# Patient Record
Sex: Female | Born: 1945 | Race: White | Hispanic: No | Smoking: Former smoker
Health system: Southern US, Community
[De-identification: ages and names within clinical notes are randomized; demographics above are authoritative.]

## PROBLEM LIST (undated history)

## (undated) DIAGNOSIS — D72829 Elevated white blood cell count, unspecified: Secondary | ICD-10-CM

## (undated) DIAGNOSIS — E039 Hypothyroidism, unspecified: Secondary | ICD-10-CM

## (undated) DIAGNOSIS — I10 Essential (primary) hypertension: Secondary | ICD-10-CM

## (undated) DIAGNOSIS — C50919 Malignant neoplasm of unspecified site of unspecified female breast: Secondary | ICD-10-CM

## (undated) DIAGNOSIS — R000 Tachycardia, unspecified: Secondary | ICD-10-CM

## (undated) DIAGNOSIS — E785 Hyperlipidemia, unspecified: Secondary | ICD-10-CM

## (undated) DIAGNOSIS — G4733 Obstructive sleep apnea (adult) (pediatric): Secondary | ICD-10-CM

## (undated) HISTORY — DX: Malignant neoplasm of unspecified site of unspecified female breast: C50.919

## (undated) HISTORY — DX: Elevated white blood cell count, unspecified: D72.829

## (undated) HISTORY — PX: OTHER SURGICAL HISTORY: SHX169

## (undated) HISTORY — DX: Hypothyroidism, unspecified: E03.9

## (undated) HISTORY — DX: Obstructive sleep apnea (adult) (pediatric): G47.33

## (undated) HISTORY — PX: MASTECTOMY: SHX3

## (undated) HISTORY — DX: Hyperlipidemia, unspecified: E78.5

## (undated) HISTORY — DX: Tachycardia, unspecified: R00.0

## (undated) HISTORY — DX: Essential (primary) hypertension: I10

---

## 1998-03-21 ENCOUNTER — Ambulatory Visit (HOSPITAL_BASED_OUTPATIENT_CLINIC_OR_DEPARTMENT_OTHER): Admission: RE | Admit: 1998-03-21 | Discharge: 1998-03-21 | Payer: Self-pay | Admitting: General Surgery

## 1998-07-25 ENCOUNTER — Other Ambulatory Visit: Admission: RE | Admit: 1998-07-25 | Discharge: 1998-07-25 | Payer: Self-pay | Admitting: Obstetrics and Gynecology

## 2001-05-13 ENCOUNTER — Encounter: Admission: RE | Admit: 2001-05-13 | Discharge: 2001-05-13 | Payer: Self-pay | Admitting: *Deleted

## 2002-06-08 ENCOUNTER — Encounter: Payer: Self-pay | Admitting: Hematology and Oncology

## 2002-06-08 ENCOUNTER — Encounter: Admission: RE | Admit: 2002-06-08 | Discharge: 2002-06-08 | Payer: Self-pay | Admitting: Hematology and Oncology

## 2003-06-14 ENCOUNTER — Encounter: Payer: Self-pay | Admitting: Family Medicine

## 2003-06-14 ENCOUNTER — Encounter: Admission: RE | Admit: 2003-06-14 | Discharge: 2003-06-14 | Payer: Self-pay | Admitting: Family Medicine

## 2004-07-11 ENCOUNTER — Encounter: Admission: RE | Admit: 2004-07-11 | Discharge: 2004-07-11 | Payer: Self-pay | Admitting: Family Medicine

## 2005-07-18 ENCOUNTER — Encounter: Admission: RE | Admit: 2005-07-18 | Discharge: 2005-07-18 | Payer: Self-pay | Admitting: Family Medicine

## 2006-07-21 ENCOUNTER — Encounter: Admission: RE | Admit: 2006-07-21 | Discharge: 2006-07-21 | Payer: Self-pay | Admitting: Family Medicine

## 2007-07-23 ENCOUNTER — Encounter: Admission: RE | Admit: 2007-07-23 | Discharge: 2007-07-23 | Payer: Self-pay | Admitting: Family Medicine

## 2008-07-25 ENCOUNTER — Encounter: Admission: RE | Admit: 2008-07-25 | Discharge: 2008-07-25 | Payer: Self-pay | Admitting: Family Medicine

## 2009-01-09 ENCOUNTER — Ambulatory Visit: Payer: Self-pay | Admitting: Oncology

## 2009-02-09 ENCOUNTER — Ambulatory Visit: Payer: Self-pay | Admitting: Oncology

## 2009-03-11 ENCOUNTER — Ambulatory Visit: Payer: Self-pay | Admitting: Oncology

## 2009-05-16 ENCOUNTER — Ambulatory Visit: Payer: Self-pay | Admitting: Oncology

## 2009-06-11 ENCOUNTER — Ambulatory Visit: Payer: Self-pay | Admitting: Oncology

## 2009-07-26 ENCOUNTER — Encounter: Admission: RE | Admit: 2009-07-26 | Discharge: 2009-07-26 | Payer: Self-pay | Admitting: Family Medicine

## 2009-08-11 ENCOUNTER — Ambulatory Visit: Payer: Self-pay | Admitting: Oncology

## 2009-08-29 ENCOUNTER — Ambulatory Visit: Payer: Self-pay | Admitting: Oncology

## 2009-09-11 ENCOUNTER — Ambulatory Visit: Payer: Self-pay | Admitting: Oncology

## 2010-07-27 ENCOUNTER — Encounter: Admission: RE | Admit: 2010-07-27 | Discharge: 2010-07-27 | Payer: Self-pay | Admitting: Family Medicine

## 2011-05-20 ENCOUNTER — Other Ambulatory Visit: Payer: Self-pay | Admitting: Family Medicine

## 2011-05-20 DIAGNOSIS — Z853 Personal history of malignant neoplasm of breast: Secondary | ICD-10-CM

## 2011-05-20 DIAGNOSIS — R55 Syncope and collapse: Secondary | ICD-10-CM

## 2011-05-24 ENCOUNTER — Ambulatory Visit
Admission: RE | Admit: 2011-05-24 | Discharge: 2011-05-24 | Disposition: A | Discharge status: Home / Self Care | Payer: Medicare Other | Source: Ambulatory Visit | Attending: Family Medicine | Admitting: Family Medicine

## 2011-05-24 DIAGNOSIS — R55 Syncope and collapse: Secondary | ICD-10-CM

## 2011-05-24 DIAGNOSIS — Z853 Personal history of malignant neoplasm of breast: Secondary | ICD-10-CM

## 2011-05-24 MED ORDER — GADOBENATE DIMEGLUMINE 529 MG/ML IV SOLN
13.0000 mL | Freq: Once | INTRAVENOUS | Status: AC | PRN
  Administered 2011-05-24: 13 mL via INTRAVENOUS

## 2011-06-24 ENCOUNTER — Other Ambulatory Visit: Payer: Self-pay | Admitting: Family Medicine

## 2011-06-24 DIAGNOSIS — Z9012 Acquired absence of left breast and nipple: Secondary | ICD-10-CM

## 2011-06-24 DIAGNOSIS — Z1231 Encounter for screening mammogram for malignant neoplasm of breast: Secondary | ICD-10-CM

## 2011-07-19 ENCOUNTER — Other Ambulatory Visit: Payer: Self-pay | Admitting: Family Medicine

## 2011-07-19 DIAGNOSIS — M949 Disorder of cartilage, unspecified: Secondary | ICD-10-CM

## 2011-07-30 ENCOUNTER — Ambulatory Visit: Payer: Medicare Other

## 2011-08-01 ENCOUNTER — Ambulatory Visit
Admission: RE | Admit: 2011-08-01 | Discharge: 2011-08-01 | Disposition: A | Discharge status: Home / Self Care | Payer: Medicare Other | Source: Ambulatory Visit | Attending: Family Medicine | Admitting: Family Medicine

## 2011-08-01 DIAGNOSIS — M899 Disorder of bone, unspecified: Secondary | ICD-10-CM

## 2011-08-01 DIAGNOSIS — Z9012 Acquired absence of left breast and nipple: Secondary | ICD-10-CM

## 2011-08-01 DIAGNOSIS — Z1231 Encounter for screening mammogram for malignant neoplasm of breast: Secondary | ICD-10-CM

## 2011-08-01 DIAGNOSIS — M949 Disorder of cartilage, unspecified: Secondary | ICD-10-CM

## 2011-08-02 ENCOUNTER — Encounter: Payer: Medicare Other | Admitting: Oncology

## 2011-08-02 ENCOUNTER — Other Ambulatory Visit: Payer: Self-pay | Admitting: Oncology

## 2011-08-02 ENCOUNTER — Encounter (HOSPITAL_BASED_OUTPATIENT_CLINIC_OR_DEPARTMENT_OTHER): Payer: Medicare Other | Admitting: Oncology

## 2011-08-02 DIAGNOSIS — C50419 Malignant neoplasm of upper-outer quadrant of unspecified female breast: Secondary | ICD-10-CM

## 2011-08-02 DIAGNOSIS — D72829 Elevated white blood cell count, unspecified: Secondary | ICD-10-CM

## 2011-08-02 LAB — COMPREHENSIVE METABOLIC PANEL
Albumin: 4.1 g/dL (ref 3.5–5.2)
Alkaline Phosphatase: 91 U/L (ref 39–117)
BUN: 12 mg/dL (ref 6–23)
CO2: 24 mEq/L (ref 19–32)
Glucose, Bld: 104 mg/dL — ABNORMAL HIGH (ref 70–99)
Potassium: 3.6 mEq/L (ref 3.5–5.3)

## 2011-08-02 LAB — CBC WITH DIFFERENTIAL/PLATELET
BASO%: 0.3 % (ref 0.0–2.0)
Basophils Absolute: 0 10*3/uL (ref 0.0–0.1)
EOS%: 1.6 % (ref 0.0–7.0)
HCT: 42.5 % (ref 34.8–46.6)
LYMPH%: 19.7 % (ref 14.0–49.7)
MCH: 31.3 pg (ref 25.1–34.0)
MCHC: 34.6 g/dL (ref 31.5–36.0)
MONO#: 0.5 10*3/uL (ref 0.1–0.9)
NEUT%: 74.4 % (ref 38.4–76.8)
Platelets: 321 10*3/uL (ref 145–400)

## 2011-08-06 LAB — JAK-2 V617F

## 2011-12-14 ENCOUNTER — Telehealth: Payer: Self-pay | Admitting: Oncology

## 2011-12-14 NOTE — Telephone Encounter (Signed)
lmonvm re appts for feb/may and mailed schedule.

## 2011-12-24 ENCOUNTER — Other Ambulatory Visit: Payer: Self-pay | Admitting: Oncology

## 2011-12-24 DIAGNOSIS — D72829 Elevated white blood cell count, unspecified: Secondary | ICD-10-CM

## 2011-12-25 ENCOUNTER — Telehealth: Payer: Self-pay | Admitting: *Deleted

## 2011-12-25 ENCOUNTER — Other Ambulatory Visit (HOSPITAL_BASED_OUTPATIENT_CLINIC_OR_DEPARTMENT_OTHER): Payer: Medicare Other | Admitting: Lab

## 2011-12-25 DIAGNOSIS — D72829 Elevated white blood cell count, unspecified: Secondary | ICD-10-CM

## 2011-12-25 LAB — CHCC SMEAR

## 2011-12-25 LAB — CBC WITH DIFFERENTIAL/PLATELET
BASO%: 0.1 % (ref 0.0–2.0)
Eosinophils Absolute: 0.2 10*3/uL (ref 0.0–0.5)
LYMPH%: 25.5 % (ref 14.0–49.7)
MCHC: 34.3 g/dL (ref 31.5–36.0)
MONO#: 0.6 10*3/uL (ref 0.1–0.9)
NEUT#: 7.2 10*3/uL — ABNORMAL HIGH (ref 1.5–6.5)
Platelets: 325 10*3/uL (ref 145–400)
RBC: 4.6 10*6/uL (ref 3.70–5.45)
RDW: 13.4 % (ref 11.2–14.5)
WBC: 10.8 10*3/uL — ABNORMAL HIGH (ref 3.9–10.3)
lymph#: 2.7 10*3/uL (ref 0.9–3.3)

## 2011-12-25 NOTE — Telephone Encounter (Signed)
Left VM for pt informing her WBC is a little better and to keep appt in May as scheduled.  Asked her to call back if she would like more specifics or any questions.

## 2011-12-25 NOTE — Telephone Encounter (Signed)
Message copied by Wende Mott on Wed Dec 25, 2011  4:03 PM ------      Message from: HA, Raliegh Ip T      Created: Wed Dec 25, 2011  2:48 PM       Please call pt.  Her WBC is still elevated; but better than before.  Continue observation.

## 2012-03-20 ENCOUNTER — Encounter: Payer: Self-pay | Admitting: Oncology

## 2012-03-20 DIAGNOSIS — E785 Hyperlipidemia, unspecified: Secondary | ICD-10-CM | POA: Insufficient documentation

## 2012-03-20 DIAGNOSIS — D72829 Elevated white blood cell count, unspecified: Secondary | ICD-10-CM | POA: Insufficient documentation

## 2012-03-20 DIAGNOSIS — E559 Vitamin D deficiency, unspecified: Secondary | ICD-10-CM | POA: Insufficient documentation

## 2012-03-20 DIAGNOSIS — C50919 Malignant neoplasm of unspecified site of unspecified female breast: Secondary | ICD-10-CM | POA: Insufficient documentation

## 2012-03-20 DIAGNOSIS — E039 Hypothyroidism, unspecified: Secondary | ICD-10-CM | POA: Insufficient documentation

## 2012-03-23 ENCOUNTER — Other Ambulatory Visit (HOSPITAL_BASED_OUTPATIENT_CLINIC_OR_DEPARTMENT_OTHER): Payer: Medicare Other | Admitting: Lab

## 2012-03-23 ENCOUNTER — Ambulatory Visit (HOSPITAL_BASED_OUTPATIENT_CLINIC_OR_DEPARTMENT_OTHER): Payer: Medicare Other | Admitting: Oncology

## 2012-03-23 VITALS — BP 130/78 | HR 99 | Temp 97.4°F | Ht <= 58 in | Wt 136.7 lb

## 2012-03-23 DIAGNOSIS — E039 Hypothyroidism, unspecified: Secondary | ICD-10-CM

## 2012-03-23 DIAGNOSIS — E559 Vitamin D deficiency, unspecified: Secondary | ICD-10-CM

## 2012-03-23 DIAGNOSIS — Z853 Personal history of malignant neoplasm of breast: Secondary | ICD-10-CM

## 2012-03-23 DIAGNOSIS — C50919 Malignant neoplasm of unspecified site of unspecified female breast: Secondary | ICD-10-CM

## 2012-03-23 DIAGNOSIS — D72829 Elevated white blood cell count, unspecified: Secondary | ICD-10-CM

## 2012-03-23 DIAGNOSIS — E785 Hyperlipidemia, unspecified: Secondary | ICD-10-CM

## 2012-03-23 LAB — COMPREHENSIVE METABOLIC PANEL
Albumin: 4.1 g/dL (ref 3.5–5.2)
Alkaline Phosphatase: 95 U/L (ref 39–117)
BUN: 12 mg/dL (ref 6–23)
Calcium: 9.3 mg/dL (ref 8.4–10.5)
Chloride: 100 mEq/L (ref 96–112)
Glucose, Bld: 95 mg/dL (ref 70–99)
Potassium: 3.8 mEq/L (ref 3.5–5.3)
Sodium: 135 mEq/L (ref 135–145)
Total Protein: 6.7 g/dL (ref 6.0–8.3)

## 2012-03-23 LAB — CBC WITH DIFFERENTIAL/PLATELET
Basophils Absolute: 0.1 10*3/uL (ref 0.0–0.1)
EOS%: 3.1 % (ref 0.0–7.0)
Eosinophils Absolute: 0.4 10*3/uL (ref 0.0–0.5)
HCT: 41 % (ref 34.8–46.6)
HGB: 13.6 g/dL (ref 11.6–15.9)
MONO#: 0.7 10*3/uL (ref 0.1–0.9)
NEUT#: 9.3 10*3/uL — ABNORMAL HIGH (ref 1.5–6.5)
NEUT%: 71 % (ref 38.4–76.8)
RDW: 13.5 % (ref 11.2–14.5)
WBC: 13 10*3/uL — ABNORMAL HIGH (ref 3.9–10.3)
lymph#: 2.6 10*3/uL (ref 0.9–3.3)

## 2012-03-23 NOTE — Progress Notes (Signed)
[facility]  [phone] [phone]   OFFICE PROGRESS NOTE   Cc:  Emeterio Reeve, MD, MD    DIAGNOSIS:  Chronic leukocytosis, NOS.    CURRENT THERAPY: watchful observation.   INTERVAL HISTORY: Cynthia Garner 66 y.o. female returns for regular follow up.  She has been feeling well.    Patient denies fatigue, headache, visual changes, confusion, drenching night sweats, palpable lymph node swelling, mucositis, odynophagia, dysphagia, nausea vomiting, jaundice, chest pain, palpitation, shortness of breath, dyspnea on exertion, productive cough, gum bleeding, epistaxis, hematemesis, hemoptysis, abdominal pain, abdominal swelling, early satiety, melena, hematochezia, hematuria, skin rash, spontaneous bleeding, joint swelling, joint pain, heat or cold intolerance, bowel bladder incontinence, back pain, focal motor weakness, paresthesia, depression, suicidal or homocidal ideation, feeling hopelessness.  She performs self-breast exam on a routine basis but has not found any abnormality.    Past Medical History  Diagnosis Date  . Leukocytosis   . Hypertension   . Hyperlipidemia   . Vitamin d deficiency   . Breast cancer     s/p left breast mastectomy; s/p adjuvant chemo; and radiation; and hormonnal therapy  . Hypothyroidism   . Tachycardia     Past Surgical History  Procedure Date  . Left mastectomy     Current Outpatient Prescriptions  Medication Sig Dispense Refill  . ergocalciferol (VITAMIN D2) 50000 UNITS capsule Take 50,000 Units by mouth once a week.      . escitalopram (LEXAPRO) 10 MG tablet Take 10 mg by mouth daily.      Marland Kitchen levothyroxine (SYNTHROID, LEVOTHROID) 100 MCG tablet Take 100 mcg by mouth daily.      Marland Kitchen lisinopril-hydrochlorothiazide (PRINZIDE,ZESTORETIC) 20-12.5 MG per tablet Take 1 tablet by mouth daily.      . metoprolol succinate (TOPROL-XL) 25 MG 24 hr tablet Take 25 mg by mouth daily.      . raloxifene (EVISTA) 60 MG tablet  Take 60 mg by mouth daily.      . simvastatin (ZOCOR) 20 MG tablet Take 20 mg by mouth every evening.        ALLERGIES:   has no known allergies.  REVIEW OF SYSTEMS:  The rest of the 14-point review of system was negative.   Filed Vitals:   03/23/12 1336  BP: 130/78  Pulse: 99  Temp: 97.4 F (36.3 C)   Wt Readings from Last 3 Encounters:  03/23/12 136 lb 11.2 oz (62.007 kg)   ECOG Performance status: 0  PHYSICAL EXAMINATION:   General:  well-nourished in no acute distress.  Eyes:  no scleral icterus.  ENT:  There were no oropharyngeal lesions.  Neck was without thyromegaly.  Lymphatics:  Negative cervical, supraclavicular or axillary adenopathy.  Respiratory: lungs were clear bilaterally without wheezing or crackles.  Cardiovascular:  Regular rate and rhythm, S1/S2, without murmur, rub or gallop.  There was no pedal edema.  GI:  abdomen was soft, flat, nontender, nondistended, without organomegaly.  Muscoloskeletal:  no spinal tenderness of palpation of vertebral spine.  Skin exam was without echymosis, petichae.  Neuro exam was nonfocal.  Patient was able to get on and off exam table without assistance.  Gait was normal.  Patient was alerted and oriented.  Attention was good.   Language was appropriate.  Mood was normal without depression.  Speech was not pressured.  Thought content was not tangential.  She deferred breast exam.        LABORATORY/RADIOLOGY DATA:  Lab Results  Component Value Date  WBC 13.0* 03/23/2012   HGB 13.6 03/23/2012   HCT 41.0 03/23/2012   PLT 327 03/23/2012   GLUCOSE 104* 08/02/2011   ALKPHOS 91 08/02/2011   ALT 12 08/02/2011   AST 27 08/02/2011   NA 136 08/02/2011   K 3.6 08/02/2011   CL 99 08/02/2011   CREATININE 0.79 08/02/2011   BUN 12 08/02/2011   CO2 24 08/02/2011    I personally reviewed the patient's peripheral blood smear today.  There was isocytosis.  There was no peripheral blast.  There was no schistocytosis, spherocytosis, target cell,  rouleaux formation, tear drop cell.  There was no giant platelets or platelet clumps.     ASSESSMENT AND PLAN:   1.  History of breast cancer in 2003:  She has no evidence of disease recurrence or metastasis on clinical history, physical exam.  Her next routine screening mammogram is due in September 2013.   2.  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  This has been ongoing since 2010.  My review of her blood smear again today showed very typical smear without immature WBC present.  I have very low clinical suspicion for acute leukemic process.  Without left shift, I have low concern for chronic myeloid leukemia (CML).  Also, she said that BCR/ABL was sent by a previous hematologist which was also negative.  There is no lymphocytosis to suggest chronic lymphoid leukemia (CLL).   Her absolute WBC was as high as 14K in the past.  The degree of her leukocytosis has been intermittent.   Therefore, I again recommended observation.  She would like to follow up with just Dr. Paulino Rily to minimize co-pay.  Since I'm not recommending any further testing her her leukocytosis at this time, I agree with this request.  I recommended CBC about every 6 months with Dr. Paulino Rily.  In the future, if her absolute leukocytosis is more than 15K, or she develops anemia or thrombocytopenia or constitutional symptoms, she may be referred back to the Cancer Center for further evaluation.   3.  Age-appropriate cancer screening:  She will reschedule her visit with GI for colonoscopy.    Thank you for this referral.  Please do not hesitate to contact us if we can be of further assistance.    The length of time of the face-to-face encounter was 10  minutes. More than 50% of time was spent counseling and coordination of care.

## 2012-03-25 ENCOUNTER — Telehealth: Payer: Self-pay | Admitting: *Deleted

## 2012-03-25 NOTE — Telephone Encounter (Signed)
gave patient appointment for lab only on 07-24-2012 11-23-2012 lad and midlevel 03-23-2013 starting at 10:15am

## 2012-07-02 ENCOUNTER — Other Ambulatory Visit: Payer: Self-pay | Admitting: Family Medicine

## 2012-07-02 DIAGNOSIS — Z9012 Acquired absence of left breast and nipple: Secondary | ICD-10-CM

## 2012-07-02 DIAGNOSIS — Z1231 Encounter for screening mammogram for malignant neoplasm of breast: Secondary | ICD-10-CM

## 2012-07-24 ENCOUNTER — Other Ambulatory Visit: Payer: Medicare Other | Admitting: Lab

## 2012-08-03 ENCOUNTER — Ambulatory Visit
Admission: RE | Admit: 2012-08-03 | Discharge: 2012-08-03 | Disposition: A | Discharge status: Home / Self Care | Payer: Medicare Other | Source: Ambulatory Visit | Attending: Family Medicine | Admitting: Family Medicine

## 2012-08-03 DIAGNOSIS — Z9012 Acquired absence of left breast and nipple: Secondary | ICD-10-CM

## 2012-08-03 DIAGNOSIS — Z1231 Encounter for screening mammogram for malignant neoplasm of breast: Secondary | ICD-10-CM

## 2012-08-04 ENCOUNTER — Other Ambulatory Visit: Payer: Self-pay | Admitting: Oncology

## 2012-08-04 NOTE — Progress Notes (Signed)
Received lab results from [facility]; forwarded to Dr. Gaylyn Rong.

## 2012-11-20 ENCOUNTER — Telehealth: Payer: Self-pay | Admitting: Oncology

## 2012-11-20 NOTE — Telephone Encounter (Signed)
pt called and needed to know why was she scheduled...i emailed Dr. Gaylyn Rong and confirmed that she was discharged....canceled all appt.Marland KitchenMarland KitchenDone  Yes, she was discharged from clinic to follow up with her primary care physician. Please cancel all upcoming appointments. Thanks.   ----- Message ----- From: Collier Salina Sent: 11/20/2012 11:39 AM To: Exie Parody, MD, Collier Salina  Subject: pt released   Morning, The pt called was concerned as to why she still had appointment because she had been released....would you like for me to delete her jan and may appts?

## 2012-11-23 ENCOUNTER — Other Ambulatory Visit: Payer: Medicare Other | Admitting: Lab

## 2013-03-23 ENCOUNTER — Ambulatory Visit: Payer: Medicare Other | Admitting: Oncology

## 2013-03-23 ENCOUNTER — Other Ambulatory Visit: Payer: Medicare Other | Admitting: Lab

## 2013-07-14 ENCOUNTER — Other Ambulatory Visit: Payer: Self-pay

## 2013-07-14 DIAGNOSIS — Z1231 Encounter for screening mammogram for malignant neoplasm of breast: Secondary | ICD-10-CM

## 2013-08-10 ENCOUNTER — Ambulatory Visit
Admission: RE | Admit: 2013-08-10 | Discharge: 2013-08-10 | Disposition: A | Discharge status: Home / Self Care | Payer: Medicare Other | Source: Ambulatory Visit

## 2013-08-10 DIAGNOSIS — Z1231 Encounter for screening mammogram for malignant neoplasm of breast: Secondary | ICD-10-CM

## 2014-07-12 ENCOUNTER — Other Ambulatory Visit: Payer: Self-pay

## 2014-07-12 DIAGNOSIS — Z1231 Encounter for screening mammogram for malignant neoplasm of breast: Secondary | ICD-10-CM

## 2014-07-12 DIAGNOSIS — Z853 Personal history of malignant neoplasm of breast: Secondary | ICD-10-CM

## 2014-07-12 DIAGNOSIS — Z9012 Acquired absence of left breast and nipple: Secondary | ICD-10-CM

## 2014-08-11 ENCOUNTER — Ambulatory Visit
Admission: RE | Admit: 2014-08-11 | Discharge: 2014-08-11 | Disposition: A | Discharge status: Home / Self Care | Payer: Medicare Other | Source: Ambulatory Visit

## 2014-08-11 DIAGNOSIS — Z9012 Acquired absence of left breast and nipple: Secondary | ICD-10-CM

## 2014-08-11 DIAGNOSIS — Z853 Personal history of malignant neoplasm of breast: Secondary | ICD-10-CM

## 2014-08-11 DIAGNOSIS — Z1231 Encounter for screening mammogram for malignant neoplasm of breast: Secondary | ICD-10-CM

## 2014-08-29 ENCOUNTER — Encounter: Payer: Self-pay | Admitting: Cardiovascular Disease

## 2014-08-29 ENCOUNTER — Ambulatory Visit (HOSPITAL_COMMUNITY): Payer: Medicare Other

## 2014-08-29 ENCOUNTER — Ambulatory Visit (INDEPENDENT_AMBULATORY_CARE_PROVIDER_SITE_OTHER): Payer: Medicare Other | Admitting: Cardiovascular Disease

## 2014-08-29 VITALS — BP 132/80 | HR 115 | Ht <= 58 in | Wt 151.1 lb

## 2014-08-29 DIAGNOSIS — I4891 Unspecified atrial fibrillation: Secondary | ICD-10-CM

## 2014-08-29 DIAGNOSIS — R000 Tachycardia, unspecified: Secondary | ICD-10-CM

## 2014-08-29 DIAGNOSIS — E785 Hyperlipidemia, unspecified: Secondary | ICD-10-CM

## 2014-08-29 DIAGNOSIS — E039 Hypothyroidism, unspecified: Secondary | ICD-10-CM

## 2014-08-29 DIAGNOSIS — I4892 Unspecified atrial flutter: Secondary | ICD-10-CM

## 2014-08-29 LAB — CBC WITH DIFFERENTIAL/PLATELET
BASOS ABS: 0 10*3/uL (ref 0.0–0.1)
Basophils Relative: 0.4 % (ref 0.0–3.0)
EOS ABS: 0.2 10*3/uL (ref 0.0–0.7)
Eosinophils Relative: 2.3 % (ref 0.0–5.0)
HCT: 42.1 % (ref 36.0–46.0)
Hemoglobin: 13.7 g/dL (ref 12.0–15.0)
LYMPHS ABS: 3.2 10*3/uL (ref 0.7–4.0)
Lymphocytes Relative: 30.4 % (ref 12.0–46.0)
MCHC: 32.6 g/dL (ref 30.0–36.0)
MCV: 90.5 fl (ref 78.0–100.0)
MONO ABS: 0.7 10*3/uL (ref 0.1–1.0)
Monocytes Relative: 6.7 % (ref 3.0–12.0)
NEUTROS ABS: 6.4 10*3/uL (ref 1.4–7.7)
Neutrophils Relative %: 60.2 % (ref 43.0–77.0)
PLATELETS: 402 10*3/uL — AB (ref 150.0–400.0)
RBC: 4.65 Mil/uL (ref 3.87–5.11)
RDW: 14.1 % (ref 11.5–15.5)
WBC: 10.6 10*3/uL — AB (ref 4.0–10.5)

## 2014-08-29 LAB — BASIC METABOLIC PANEL
BUN: 13 mg/dL (ref 6–23)
CALCIUM: 9.7 mg/dL (ref 8.4–10.5)
CO2: 31 meq/L (ref 19–32)
CREATININE: 1 mg/dL (ref 0.4–1.2)
Chloride: 99 mEq/L (ref 96–112)
GFR: 61.86 mL/min (ref >60.00)
Glucose, Bld: 105 mg/dL — ABNORMAL HIGH (ref 70–99)
Potassium: 4.2 mEq/L (ref 3.5–5.1)
Sodium: 138 mEq/L (ref 135–145)

## 2014-08-29 NOTE — Assessment & Plan Note (Signed)
Reviewed ECG with Dr Rayann Heman EP He agrees this could be sinus tachycardia  Agree with lower synthroid does and increased beta blocker  Diagnostically will try to get echo today to see mitral and PV inflow pattern support flutter or sinus.  Doubt ectopic atrial tachycardia as P wave morphology looks normal.  Discussed outpatient evaluation tomorrow with adenosine to see what mechanism of tachycardia is  Echo to be done 3:00 and labs to include BMET and CBC today

## 2014-08-29 NOTE — Assessment & Plan Note (Signed)
F/U TSH and T4 in 4 weeks dose recently decreased due to surpressed TSH and tachycardia

## 2014-08-29 NOTE — Addendum Note (Signed)
Addended by: Devra Dopp E on: 08/29/2014 02:49 PM   Modules accepted: Orders

## 2014-08-29 NOTE — Assessment & Plan Note (Signed)
Cholesterol is at goal.  Continue current dose of statin and diet Rx.  No myalgias or side effects.  F/U  LFT's in 6 months. No results found for this basename: LDLCALC  Labs with primary            

## 2014-08-29 NOTE — Progress Notes (Signed)
Patient ID: Cynthia Garner, female   DOB: [birth year], 68 y.o.   MRN: [id number]   68 yo referred by Dr Stephanie Acre for atrial flutter.  Patient had been noticing palpitations and elevated HR seen in primary office 10/9 and noted to be in atrial flutter.  No chest pain dyspnea or syncope.  Seen by Dr Irish Lack in 2012 with "complete negative w/u"  Labs reviewed and Free T4 1.44 synthroid decreased to 88 ug   TSH was .75    Normal ETT and echo in 05/2011  Reviewed [facility] notes 05/20/11 w/u for pre syncope Stress myovue normla EF 90% post stress   Of note she had tachycardia back then that was Rx with lopressor and coumadin but Dr Irish Lack thought it was sinus tachycardia and stopped anticoagulation  Has noted increased HR on exam  Needs eye surgery on left but on hold due to tachycardia    In office HR 113.  Appears to be P waves in inferior leads  Not typical flutter waves.  With vagal maneuvers and carotid massage no change in HR     ROS: Denies fever, malais, weight loss, blurry vision, decreased visual acuity, cough, sputum, SOB, hemoptysis, pleuritic pain, palpitaitons, heartburn, abdominal pain, melena, lower extremity edema, claudication, or rash.  All other systems reviewed and negative   General: Affect appropriate Healthy:  appears stated age 68: normal Neck supple with no adenopathy JVP normal no bruits no thyromegaly Lungs clear with no wheezing and good diaphragmatic motion Heart:  S1/S2 no murmur,rub, gallop or click PMI normal Abdomen: benighn, BS positve, no tenderness, no AAA no bruit.  No HSM or HJR Distal pulses intact with no bruits No edema Neuro non-focal Skin warm and dry No muscular weakness  Medications Current Outpatient Prescriptions  Medication Sig Dispense Refill  . amitriptyline (ELAVIL) 10 MG tablet Take 10 mg by mouth at bedtime.      . cyclobenzaprine (FLEXERIL) 10 MG tablet Take 10 mg by mouth 3 (three) times daily as needed for muscle spasms.       . ergocalciferol (VITAMIN D2) 50000 UNITS capsule Take 50,000 Units by mouth once a week.      . levothyroxine (SYNTHROID, LEVOTHROID) 100 MCG tablet Take 100 mcg by mouth daily.      Marland Kitchen lisinopril-hydrochlorothiazide (PRINZIDE,ZESTORETIC) 20-12.5 MG per tablet Take 1 tablet by mouth daily.      . metoprolol succinate (TOPROL-XL) 25 MG 24 hr tablet Take 25 mg by mouth daily.      . raloxifene (EVISTA) 60 MG tablet Take 60 mg by mouth daily.      . simvastatin (ZOCOR) 20 MG tablet Take 20 mg by mouth every evening.       No current facility-administered medications for this visit.    Allergies Review of patient's allergies indicates no known allergies.  Family History: Family History  Problem Relation Age of Onset  . Heart disease Father   . CVA Father     Social History: History   Social History  . Marital Status: Single    Spouse Name: N/A    Number of Children: N/A  . Years of Education: N/A   Occupational History  . Not on file.   Social History Main Topics  . Smoking status: Former Research scientist (life sciences)  . Smokeless tobacco: Not on file  . Alcohol Use: Not on file  . Drug Use: Not on file  . Sexual Activity: Not on file   Other Topics Concern  . Not on  file   Social History Narrative  . No narrative on file    Electrocardiogram:  Atrial flutter per primary but to me ? Sinus tachycardia.  ECG in our office today ? Sinus tachycardia rate 113 ICRBBB See multiple rhythm strips with vagal maneuvers no change in rhythm or slowing.    Assessment and Plan

## 2014-08-30 ENCOUNTER — Encounter (HOSPITAL_COMMUNITY)
Admission: AD | Disposition: A | Discharge status: Home / Self Care | Payer: Self-pay | Source: Ambulatory Visit | Attending: Cardiovascular Disease

## 2014-08-30 ENCOUNTER — Ambulatory Visit (HOSPITAL_COMMUNITY)
Admission: AD | Admit: 2014-08-30 | Discharge: 2014-08-30 | Disposition: A | Discharge status: Home / Self Care | Payer: Medicare Other | Source: Ambulatory Visit | Attending: Cardiovascular Disease | Admitting: Cardiovascular Disease

## 2014-08-30 ENCOUNTER — Other Ambulatory Visit: Payer: Self-pay | Admitting: Cardiovascular Disease

## 2014-08-30 ENCOUNTER — Other Ambulatory Visit: Payer: Self-pay

## 2014-08-30 ENCOUNTER — Ambulatory Visit (HOSPITAL_BASED_OUTPATIENT_CLINIC_OR_DEPARTMENT_OTHER): Payer: Medicare Other | Admitting: Radiology

## 2014-08-30 DIAGNOSIS — R000 Tachycardia, unspecified: Secondary | ICD-10-CM

## 2014-08-30 DIAGNOSIS — I4891 Unspecified atrial fibrillation: Secondary | ICD-10-CM

## 2014-08-30 HISTORY — PX: CARDIOVERSION: SHX1299

## 2014-08-30 SURGERY — CARDIOVERSION
Anesthesia: LOCAL

## 2014-08-30 MED ORDER — SODIUM CHLORIDE 0.9 % IV SOLN: 250.0000 mL | INTRAVENOUS | Status: DC

## 2014-08-30 MED ORDER — ADENOSINE 6 MG/2ML IV SOLN
INTRAVENOUS | Status: AC
  Filled 2014-08-30: qty 6

## 2014-08-30 MED ORDER — SODIUM CHLORIDE 0.9 % IJ SOLN: 3.0000 mL | INTRAMUSCULAR | Status: DC | PRN

## 2014-08-30 MED ORDER — SODIUM CHLORIDE 0.9 % IJ SOLN: 3.0000 mL | Freq: Two times a day (BID) | INTRAMUSCULAR | Status: DC

## 2014-08-30 NOTE — CV Procedure (Signed)
Patient seen in office this week with inappropriate tachycardia.  Initial ECG read as atrial flutter and strated on xarelto and toprol by primary Office ECG suggested sinus tachycardia Hct 42.1  10/0/15  TSH was .75 and T4 1.44 primary decreased by primary.  Patient noted palpitations And relatively elevated HR.  Admitted to cath lab holding for adenosine challenge  Baseline HR 96-116 With both 6mg  and 12 mg of adenosine no change in rhythm  No flutter waves seen no breaking of atrial tachycardia  Patient tolerated bolus's well  Impression:  Patient appears to have sinus tachycardia of unknown etiology  Thyroid labs only mildly abnormal, not anemic  Reviewed echo from today And EF normal  Mitral and Pulmonary Vein doppler also suggest sinus mechanism  Will continue Rx with toprol.  Stop xarelto.  Refer to EP have discussed With Dr Rayann Heman  ? Would would Corlanor be appropriate drug for her.  Jenkins Rouge

## 2014-08-30 NOTE — Discharge Instructions (Addendum)
Dr Jackalyn Lombard nurse will call you for appt Dr Kyla Balzarine nurse will call you for appt F/u with primary for further adjustment of your thyroid    [employer] cardioversion is the use of medicine to convert an abnormal heart rhythm back to normal. This treatment may be used when the heart rhythm is not dangerous and vital signs, such as blood pressure, stay in the normal range. Medicine may be used first to treat a fast and irregular heart rhythm (atrial fibrillation). You may have noticed it only because you had mild shortness of breath, were tired, felt dizzy, or felt your heart beating fast.  WHAT CAN I EXPECT?  You may have to stay in the hospital for a few days so your heart rhythm can be monitored all the time.  There are several medicines that may be used depending on the rhythm of your heart.  It may take a combination of medicines before the rhythm changes.  If medicine does not work, Dealer cardioversion may be used. This involves putting you briefly to sleep and applying two paddles to the front of your chest to deliver a quick electrical current. This adjusts your heart rhythm back to normal.  Once your heart rhythm is normal again, you may still need to take medicine to increase your chances of keeping a regular heart rhythm. Document Released: 08/19/2006 Document Revised: 08/18/2013 Document Reviewed: 05/05/2013 [facility] Patient Information 2015 [place], [place]. This information is not intended to replace advice given to you by your health care provider. Make sure you discuss any questions you have with your health care provider.

## 2014-08-30 NOTE — Progress Notes (Signed)
Echocardiogram performed.  

## 2014-08-30 NOTE — Progress Notes (Signed)
Pt for Adenocard challenge per Dr Johnsie Cancel. 6mg  adenocard injected at 1609. 12 mg adenocard  Injected at 18 by Adela Lank RN .  Results disscussed with pt by Dr Johnsie Cancel. Pt tolerated procedure well.

## 2014-08-31 ENCOUNTER — Telehealth: Payer: Self-pay | Admitting: *Deleted

## 2014-08-31 NOTE — Telephone Encounter (Signed)
PT  AWARE TO  STOP  XARELTO  AND  F/U APPT WITH  DR Johnsie Cancel   ALSO  IS  AWARE  DR ALLRED'S SCHEDULER  WILL CALL WITH AN APPT TO  SEE DR ALLRED IN  1-2 WEEKS./CY

## 2014-09-05 ENCOUNTER — Ambulatory Visit (INDEPENDENT_AMBULATORY_CARE_PROVIDER_SITE_OTHER): Payer: Medicare Other | Admitting: Internal Medicine

## 2014-09-05 ENCOUNTER — Encounter: Payer: Self-pay | Admitting: Internal Medicine

## 2014-09-05 VITALS — BP 124/68 | HR 106 | Ht <= 58 in | Wt 154.6 lb

## 2014-09-05 DIAGNOSIS — R0602 Shortness of breath: Secondary | ICD-10-CM

## 2014-09-05 DIAGNOSIS — R0683 Snoring: Secondary | ICD-10-CM

## 2014-09-05 DIAGNOSIS — R000 Tachycardia, unspecified: Secondary | ICD-10-CM

## 2014-09-05 LAB — D-DIMER, QUANTITATIVE (NOT AT ARMC): D-Dimer, Quant: 0.54 ug/mL-FEU — ABNORMAL HIGH (ref 0.00–0.48)

## 2014-09-05 NOTE — Patient Instructions (Signed)
Your physician recommends that you schedule a follow-up appointment in: 8-10 weeks with Dr Rayann Heman   Increase fluids and salt  Decrease caffeine  You have been referred to Dr Nehemiah Settle   Your physician has recommended that you have a pulmonary function test. Pulmonary Function Tests are a group of tests that measure how well air moves in and out of your lungs.  A chest x-ray takes a picture of the organs and structures inside the chest, including the heart, lungs, and blood vessels. This test can show several things, including, whether the heart is enlarges; whether fluid is building up in the lungs; and whether pacemaker / defibrillator leads are still in place.  Your physician has requested that you have an exercise tolerance test. For further information please visit [email or web address]. Please also follow instruction sheet, as given.  Your physician recommends that you return for lab work today: D-dimmer

## 2014-09-05 NOTE — Progress Notes (Signed)
Primary Care Physician: Lilian Coma, MD Referring Physician: Dr. Kerry Kass Cynthia Garner is a 68 y.o. female with a h/o sinus tachycardia that is here EP evaluation. She was first aware of a fast heart rate 3 years ago and started on metoprolol. She recently went to the dentist and it was mentioned her heart rate was in the 120's. She ultimately had consult with Dr. Johnsie Cancel and was admitted for adenosine challenge.With both 6mg  and 12 mg of adenosine no change in rhythm No flutter waves seen no breaking of atrial tachycardia. She has h/o hypothyroidism and thyroid replacement was recently reduced. She has snoring history and has gained 15 lbs over the last year after smoking cessation. She walks on the treadmill daily but has had to reduce her speed due to dyspnea and fatigue. Recently, she had to drag out a heavy trash can and noted dyspnea. H/o Left breast cancer treated with Adriamycin 17 yrs ago.  Recent echo revealed mild relaxation abnormality.  Today, she denies symptoms of palpitations, chest pain, positive for shortness of breath, no orthopnea, PND, lower extremity edema, dizziness, presyncope, syncope, or neurologic sequela. The patient is tolerating medications without difficulties and is otherwise without complaint today.   Past Medical History  Diagnosis Date  . Leukocytosis   . Hypertension   . Hyperlipidemia   . Vitamin D deficiency   . Breast cancer     s/p left breast mastectomy; s/p adjuvant chemo; and radiation; and hormonnal therapy  . Hypothyroidism   . Tachycardia    Past Surgical History  Procedure Laterality Date  . Left mastectomy      Current Outpatient Prescriptions  Medication Sig Dispense Refill  . amitriptyline (ELAVIL) 50 MG tablet Take 50 mg by mouth at bedtime.      Marland Kitchen aspirin 81 MG tablet Take 81 mg by mouth daily.      Marland Kitchen levothyroxine (SYNTHROID, LEVOTHROID) 88 MCG tablet Take 88 mcg by mouth daily before breakfast.      .  lisinopril-hydrochlorothiazide (PRINZIDE,ZESTORETIC) 20-12.5 MG per tablet Take 1 tablet by mouth daily.      . metoprolol succinate (TOPROL-XL) 50 MG 24 hr tablet Take 50 mg by mouth daily. Take with or immediately following a meal.      . simvastatin (ZOCOR) 20 MG tablet Take 20 mg by mouth every morning.        No current facility-administered medications for this visit.    No Known Allergies  History   Social History  . Marital Status: Single    Spouse Name: N/A    Number of Children: N/A  . Years of Education: N/A   Occupational History  . Not on file.   Social History Main Topics  . Smoking status: Former Research scientist (life sciences)  . Smokeless tobacco: Not on file  . Alcohol Use: Not on file  . Drug Use: Not on file  . Sexual Activity: Not on file   Other Topics Concern  . Not on file   Social History Narrative  . No narrative on file    Family History  Problem Relation Age of Onset  . Heart disease Father   . CVA Father     ROS- All systems are reviewed and negative except as per the HPI above  Physical Exam: Filed Vitals:   09/05/14 1524 09/05/14 1525 09/05/14 1526 09/05/14 1528  BP: 115/74 108/76 106/80 124/68  Pulse: 104 111 107 106  Height:      Weight:  SpO2: 91% 94% 98% 98%    GEN- The patient is well appearing, alert and oriented x 3 today.   Head- normocephalic, atraumatic Eyes-  Sclera clear, conjunctiva pink Ears- hearing intact Oropharynx- clear Neck- supple, no JVP Lymph- no cervical lymphadenopathy Lungs- Clear to ausculation bilaterally, normal work of breathing Heart- Regular rate and rhythm, no murmurs, rubs or gallops, PMI not laterally displaced GI- soft, NT, ND, + BS Extremities- no clubbing, cyanosis, or edema MS- no significant deformity or atrophy Skin- no rash or lesion Psych- euthymic mood, full affect Neuro- strength and sensation are intact  EKG-Sinus tach at 108 bpm.IRBBB.Possible inferior infarct, age undetermined.  Echo - Left  ventricle: The cavity size was normal. There was mild focal basal hypertrophy of the septum. Systolic function was normal. The estimated ejection fraction was in the range of 50% to 55%. - Atrial septum: No defect or patent foramen ovale was identified. - Impressions: Mitral inflow and PV doppler suggest sinus rhythm with diastolic relaxation abnormality.   Assessment and Plan: 1. Sinus tachycardia of unknown etiology. Look for underlying causes: Mildly orthostatic, increase fluids and liberalize salt intake. Decrease caffeine intake PFT's to assess lung function D dimer to look for PE's CXR Treadmill exercise test to assess for arrythmia /ischemia Sleep study for symptoms of sleep apnea  Follow closely with MD to normalize Thyroid F/U 6-8 weeks   I have seen, examined the patient, and reviewed the above assessment and plan with Roderic Palau NP.  Changes to above are made where necessary.  Pt presents with sinus tachycardia of unclear etiology. I suspect that this is secondary to an underlying process.  She is currently having her thyroid function addressed.  We will look for other causes of secondary tachycardia.  I will also further evaluate her SOB as above.  Co Sign: Thompson Grayer, MD 09/08/2014 11:18 PM

## 2014-09-07 ENCOUNTER — Other Ambulatory Visit: Payer: Self-pay | Admitting: *Deleted

## 2014-09-07 DIAGNOSIS — R000 Tachycardia, unspecified: Secondary | ICD-10-CM

## 2014-09-07 DIAGNOSIS — R0602 Shortness of breath: Secondary | ICD-10-CM

## 2014-09-08 ENCOUNTER — Ambulatory Visit (INDEPENDENT_AMBULATORY_CARE_PROVIDER_SITE_OTHER): Payer: Medicare Other | Admitting: Internal Medicine

## 2014-09-08 DIAGNOSIS — R0602 Shortness of breath: Secondary | ICD-10-CM | POA: Insufficient documentation

## 2014-09-08 LAB — PULMONARY FUNCTION TEST
DL/VA % pred: 117 %
DL/VA: 4.78 ml/min/mmHg/L
DLCO UNC % PRED: 99 %
DLCO unc: 17.38 ml/min/mmHg
FEF 25-75 POST: 1.51 L/s
FEF 25-75 PRE: 1.35 L/s
FEF2575-%Change-Post: 12 %
FEF2575-%Pred-Post: 92 %
FEF2575-%Pred-Pre: 82 %
FEV1-%Change-Post: 3 %
FEV1-%Pred-Post: 98 %
FEV1-%Pred-Pre: 95 %
FEV1-PRE: 1.74 L
FEV1-Post: 1.79 L
FEV1FVC-%Change-Post: 6 %
FEV1FVC-%Pred-Pre: 100 %
FEV6-%Change-Post: -3 %
FEV6-%PRED-POST: 95 %
FEV6-%PRED-PRE: 98 %
FEV6-POST: 2.19 L
FEV6-Pre: 2.27 L
FEV6FVC-%CHANGE-POST: 0 %
FEV6FVC-%Pred-Post: 105 %
FEV6FVC-%Pred-Pre: 105 %
FVC-%CHANGE-POST: -3 %
FVC-%PRED-POST: 90 %
FVC-%Pred-Pre: 94 %
FVC-POST: 2.19 L
FVC-PRE: 2.28 L
PRE FEV1/FVC RATIO: 76 %
Post FEV1/FVC ratio: 82 %
Post FEV6/FVC ratio: 100 %
Pre FEV6/FVC Ratio: 100 %
RV % pred: 76 %
RV: 1.48 L
TLC % pred: 89 %
TLC: 3.85 L

## 2014-09-08 NOTE — Progress Notes (Signed)
PFT done today. 

## 2014-09-13 ENCOUNTER — Encounter: Payer: Self-pay | Admitting: *Deleted

## 2014-09-14 ENCOUNTER — Ambulatory Visit (HOSPITAL_COMMUNITY)
Admission: RE | Admit: 2014-09-14 | Discharge: 2014-09-14 | Disposition: A | Discharge status: Home / Self Care | Payer: Medicare Other | Source: Ambulatory Visit | Attending: Internal Medicine | Admitting: Internal Medicine

## 2014-09-14 DIAGNOSIS — R000 Tachycardia, unspecified: Secondary | ICD-10-CM | POA: Diagnosis not present

## 2014-09-14 DIAGNOSIS — R0602 Shortness of breath: Secondary | ICD-10-CM | POA: Insufficient documentation

## 2014-09-14 MED ORDER — TECHNETIUM TO 99M ALBUMIN AGGREGATED: 6.0000 | Freq: Once | INTRAVENOUS | Status: AC | PRN

## 2014-09-14 MED ORDER — TECHNETIUM TC 99M DIETHYLENETRIAME-PENTAACETIC ACID: 40.0000 | Freq: Once | INTRAVENOUS | Status: AC | PRN

## 2014-09-15 NOTE — Telephone Encounter (Signed)
This encounter was created in error - please disregard.

## 2014-09-21 ENCOUNTER — Telehealth (HOSPITAL_COMMUNITY): Payer: Self-pay

## 2014-09-21 NOTE — Telephone Encounter (Signed)
Encounter complete. 

## 2014-09-23 ENCOUNTER — Ambulatory Visit (HOSPITAL_COMMUNITY)
Admission: RE | Admit: 2014-09-23 | Discharge: 2014-09-23 | Disposition: A | Discharge status: Home / Self Care | Payer: Medicare Other | Source: Ambulatory Visit | Attending: Internal Medicine | Admitting: Internal Medicine

## 2014-09-23 DIAGNOSIS — R0602 Shortness of breath: Secondary | ICD-10-CM

## 2014-09-23 DIAGNOSIS — R000 Tachycardia, unspecified: Secondary | ICD-10-CM

## 2014-10-03 ENCOUNTER — Telehealth: Payer: Self-pay | Admitting: Internal Medicine

## 2014-10-03 NOTE — Telephone Encounter (Signed)
New msg   Patient calling to see if test needs to be rescheduled. It was a stress test and at the time patient heart rate was 148. Please contact at [phone]

## 2014-10-05 NOTE — Telephone Encounter (Signed)
Discussed with Dr. Rayann Heman and will reschedule with him.  Cynthia Garner is going to call her and get this scheduled

## 2014-10-12 ENCOUNTER — Ambulatory Visit: Payer: Medicare Other | Admitting: Cardiovascular Disease

## 2014-10-20 ENCOUNTER — Encounter (HOSPITAL_COMMUNITY): Payer: Self-pay | Admitting: Cardiovascular Disease

## 2014-11-02 ENCOUNTER — Ambulatory Visit (INDEPENDENT_AMBULATORY_CARE_PROVIDER_SITE_OTHER): Payer: Medicare Other | Admitting: Internal Medicine

## 2014-11-02 DIAGNOSIS — R000 Tachycardia, unspecified: Secondary | ICD-10-CM

## 2014-11-02 DIAGNOSIS — R0602 Shortness of breath: Secondary | ICD-10-CM

## 2014-11-02 NOTE — Progress Notes (Signed)
Exercise Treadmill Test  Pre-Exercise Testing Evaluation Rhythm: normal sinus  Rate: 87     Test  Exercise Tolerance Test Ordering MD: Thompson Grayer, MD  Interpreting MD: Thompson Grayer, MD  Unique Test No: 1  Treadmill:  1  Indication for ETT: SOB, Tachycardia  Contraindication to ETT: No   Stress Modality: exercise - treadmill  Cardiac Imaging Performed: non   Protocol: standard Bruce - maximal  Max BP:  157/74  Max MPHR (bpm):  151 85% MPR (bpm):  128  MPHR obtained (bpm):  151 % MPHR obtained:  100  Reached 85% MPHR (min:sec):  1:55 Total Exercise Time (min-sec):  5:07  Workload in METS:  7 Borg Scale: 19  Reason ETT Terminated:  dyspnea    ST Segment Analysis At Rest: normal ST segments - no evidence of significant ST depression With Exercise: no evidence of significant ST depression  Other Information Arrhythmia:  No Angina during ETT:  absent (0) Quality of ETT:  diagnostic  ETT Interpretation:  normal - no evidence of ischemia by ST analysis  Comments: Sinus rhythm observed without arrhythmias demonstrated with exercise Poor exercise response  Recommendations: Regular daily exercise Increase Toprol XL to 75mg  daily Follow-up with Dr Maxwell Caul for sleep apnea management   OK to proceed with eye surgery.  Low risk from CV standpoint. Return to see me in 6 weeks.

## 2014-11-14 ENCOUNTER — Ambulatory Visit: Payer: Medicare Other | Admitting: Internal Medicine

## 2014-12-22 ENCOUNTER — Encounter: Payer: Self-pay | Admitting: Internal Medicine

## 2014-12-22 ENCOUNTER — Ambulatory Visit (INDEPENDENT_AMBULATORY_CARE_PROVIDER_SITE_OTHER): Payer: Medicare Other | Admitting: Internal Medicine

## 2014-12-22 VITALS — BP 112/62 | HR 77 | Ht <= 58 in | Wt 157.0 lb

## 2014-12-22 DIAGNOSIS — I471 Supraventricular tachycardia: Secondary | ICD-10-CM

## 2014-12-22 DIAGNOSIS — G4733 Obstructive sleep apnea (adult) (pediatric): Secondary | ICD-10-CM | POA: Insufficient documentation

## 2014-12-22 DIAGNOSIS — R000 Tachycardia, unspecified: Secondary | ICD-10-CM

## 2014-12-22 NOTE — Patient Instructions (Signed)
Your physician wants you to follow-up in: 6 months with Dr. Allred. You will receive a reminder letter in the mail two months in advance. If you don't receive a letter, please call our office to schedule the follow-up appointment.  

## 2014-12-22 NOTE — Progress Notes (Signed)
Primary Care Physician: Lilian Coma, MD Referring Physician: Dr. Kerry Kass Gossman is a 69 y.o. female with a h/o sinus tachycardia that is here EP evaluation. She was first aware of a fast heart rate 3 years ago and started on metoprolol. She recently went to the dentist and it was mentioned her heart rate was in the 120's. She ultimately had consult with Dr. Johnsie Cancel and was admitted for adenosine challenge.With both 6mg  and 12 mg of adenosine no change in rhythm No flutter waves seen no breaking of atrial tachycardia. She has h/o hypothyroidism and thyroid replacement was recently reduced. She has snoring history and has gained 15 lbs over the last year after smoking cessation. She walks on the treadmill daily but has had to reduce her speed due to dyspnea and fatigue. Recently, she had to drag out a heavy trash can and noted dyspnea. H/o Left breast cancer treated with Adriamycin 17 yrs ago.  Recent echo revealed mild relaxation abnormality.  On last visit,  had an VQ scan, GXT, PFT, all were normal, looking for reversible causes of s tach. Metoprolol was increased from 50 mg a day to 75 mg a day which helped to reduce the HR. She went on to have a sleep study that showed severe OSA and is now treated with cpap which has helped to improve sleep and make pt feel more rested.   She currently feels much improved.  Today, she denies symptoms of palpitations, chest pain, positive for shortness of breath, no orthopnea, PND, lower extremity edema, dizziness, presyncope, syncope, or neurologic sequela. The patient is tolerating medications without difficulties and is otherwise without complaint today.   Past Medical History  Diagnosis Date  . Leukocytosis   . Hypertension   . Hyperlipidemia   . Vitamin D deficiency   . Breast cancer     s/p left breast mastectomy; s/p adjuvant chemo; and radiation; and hormonnal therapy  . Hypothyroidism   . Tachycardia    Past Surgical History    Procedure Laterality Date  . Left mastectomy    . Cardioversion N/A 08/30/2014    Procedure: CARDIOVERSION;  Surgeon: Josue Hector, MD;  Location: [facility] CATH LAB;  Service: Cardiovascular;  Laterality: N/A;    Current Outpatient Prescriptions  Medication Sig Dispense Refill  . aspirin 81 MG tablet Take 81 mg by mouth daily.    Marland Kitchen levothyroxine (SYNTHROID, LEVOTHROID) 88 MCG tablet Take 88 mcg by mouth daily before breakfast.    . lisinopril-hydrochlorothiazide (PRINZIDE,ZESTORETIC) 20-12.5 MG per tablet Take 1 tablet by mouth daily.    . metoprolol succinate (TOPROL-XL) 25 MG 24 hr tablet Take one (25 mg) tablet along with one (50 mg) tablet by mouth daily to equal 75 mg daily. Take with or immediately following a meal.  0  . metoprolol succinate (TOPROL-XL) 50 MG 24 hr tablet Take one (25 mg) tablet along with one (50 mg) tablet by mouth daily to equal 75 mg daily. Take with or immediately following a meal.    . simvastatin (ZOCOR) 20 MG tablet Take 20 mg by mouth every morning.     . traZODone (DESYREL) 50 MG tablet Take 50 mg by mouth at bedtime.     No current facility-administered medications for this visit.    No Known Allergies  History   Social History  . Marital Status: Widowed    Spouse Name: N/A  . Number of Children: N/A  . Years of Education: N/A   Occupational History  .  Not on file.   Social History Main Topics  . Smoking status: Former Research scientist (life sciences)  . Smokeless tobacco: Not on file  . Alcohol Use: Not on file  . Drug Use: Not on file  . Sexual Activity: Not on file   Other Topics Concern  . Not on file   Social History Narrative    Family History  Problem Relation Age of Onset  . Heart disease Father   . CVA Father   . Diabetes Sister     ROS- All systems are reviewed and negative except as per the HPI above  Physical Exam: Filed Vitals:   12/22/14 1557  BP: 112/62  Pulse: 77  Height: 4\' 10"  (1.473 m)  Weight: 157 lb (71.215 kg)    GEN- The  patient is well appearing, alert and oriented x 3 today.   Head- normocephalic, atraumatic Eyes-  Sclera clear, conjunctiva pink Ears- hearing intact Oropharynx- clear Neck- supple, no JVP Lymph- no cervical lymphadenopathy Lungs- Clear to ausculation bilaterally, normal work of breathing Heart- Regular rate and rhythm, no murmurs, rubs or gallops, PMI not laterally displaced GI- soft, NT, ND, + BS Extremities- no clubbing, cyanosis, or edema MS- no significant deformity or atrophy Skin- no rash or lesion Psych- euthymic mood, full affect Neuro- strength and sensation are intact  EKG-Sinus tach at 108 bpm.IRBBB.Possible inferior infarct, age undetermined.  Echo - Left ventricle: The cavity size was normal. There was mild focal basal hypertrophy of the septum. Systolic function was normal. The estimated ejection fraction was in the range of 50% to 55%. - Atrial septum: No defect or patent foramen ovale was identified. - Impressions: Mitral inflow and PV doppler suggest sinus rhythm with diastolic relaxation abnormality.   Assessment and Plan:  1. Sinus tachycardia of unknown etiology.  Much improved with increased BB  No change in treatment.  2. Severe OSA Continue CPAP.  F/u Dr. Rayann Heman in 6 months.     Co SignRoderic Palau, MD 12/22/2014 4:28 PM   I have seen, examined the patient, and reviewed the above assessment and plan with Roderic Palau NP.  Changes to above are made where necessary.  She is doing very well at this time.  Pleased with results of sleep apnea management.  Co Sign: Thompson Grayer, MD 12/22/2014 10:26 PM

## 2015-05-08 ENCOUNTER — Ambulatory Visit (INDEPENDENT_AMBULATORY_CARE_PROVIDER_SITE_OTHER): Payer: Medicare Other | Admitting: Internal Medicine

## 2015-05-08 ENCOUNTER — Encounter: Payer: Self-pay | Admitting: Internal Medicine

## 2015-05-08 VITALS — BP 110/60 | HR 82 | Ht <= 58 in | Wt 148.4 lb

## 2015-05-08 DIAGNOSIS — G4733 Obstructive sleep apnea (adult) (pediatric): Secondary | ICD-10-CM

## 2015-05-08 DIAGNOSIS — R000 Tachycardia, unspecified: Secondary | ICD-10-CM

## 2015-05-08 DIAGNOSIS — I10 Essential (primary) hypertension: Secondary | ICD-10-CM | POA: Insufficient documentation

## 2015-05-08 NOTE — Progress Notes (Signed)
Primary Care Physician: Lilian Coma, MD Referring Physician: Dr. Kerry Kass Cynthia Garner is a 69 y.o. female with a h/o sinus tachycardia that is here EP follow-up.  She is doing great.  Symptoms are resolved. She currently feels much improved.  Unfortunately, she has not been able to continue CPAP due to allergic reaction to the mask.  Dr Maxwell Caul has tried diligently without success to rectify this issue.  Today, she denies symptoms of palpitations, chest pain, positive for shortness of breath, no orthopnea, PND, lower extremity edema, dizziness, presyncope, syncope, or neurologic sequela. The patient is tolerating medications without difficulties and is otherwise without complaint today.   Past Medical History  Diagnosis Date  . Leukocytosis   . Hypertension   . Hyperlipidemia   . Vitamin D deficiency   . Breast cancer     s/p left breast mastectomy; s/p adjuvant chemo; and radiation; and hormonnal therapy  . Hypothyroidism   . Tachycardia   . Obstructive sleep apnea     severe, followed by Dr Maxwell Caul   Past Surgical History  Procedure Laterality Date  . Left mastectomy    . Cardioversion N/A 08/30/2014    Procedure: CARDIOVERSION;  Surgeon: Josue Hector, MD;  Location: [facility] CATH LAB;  Service: Cardiovascular;  Laterality: N/A;    Current Outpatient Prescriptions  Medication Sig Dispense Refill  . aspirin 81 MG tablet Take 81 mg by mouth daily.    Marland Kitchen levothyroxine (SYNTHROID, LEVOTHROID) 88 MCG tablet Take 88 mcg by mouth daily before breakfast.    . lisinopril-hydrochlorothiazide (PRINZIDE,ZESTORETIC) 20-12.5 MG per tablet Take 1 tablet by mouth daily.    . metoprolol succinate (TOPROL-XL) 25 MG 24 hr tablet Take one (25 mg) tablet along with one (50 mg) tablet by mouth daily to equal 75 mg daily. Take with or immediately following a meal.  0  . metoprolol succinate (TOPROL-XL) 50 MG 24 hr tablet Take one (25 mg) tablet along with one (50 mg) tablet by mouth daily to equal 75  mg daily. Take with or immediately following a meal.    . simvastatin (ZOCOR) 20 MG tablet Take 20 mg by mouth every morning.     . traZODone (DESYREL) 50 MG tablet Take 50 mg by mouth at bedtime.     No current facility-administered medications for this visit.    No Known Allergies  History   Social History  . Marital Status: Widowed    Spouse Name: N/A  . Number of Children: N/A  . Years of Education: N/A   Occupational History  . Not on file.   Social History Main Topics  . Smoking status: Former Research scientist (life sciences)  . Smokeless tobacco: Not on file  . Alcohol Use: Not on file  . Drug Use: Not on file  . Sexual Activity: Not on file   Other Topics Concern  . Not on file   Social History Narrative    Family History  Problem Relation Age of Onset  . Heart disease Father   . CVA Father   . Diabetes Sister     ROS- All systems are reviewed and negative except as per the HPI above  Physical Exam: Filed Vitals:   05/08/15 1050  BP: 110/60  Pulse: 82  Height: 4\' 10"  (1.473 m)  Weight: 67.314 kg (148 lb 6.4 oz)    GEN- The patient is well appearing, alert and oriented x 3 today.   Head- normocephalic, atraumatic Eyes-  Sclera clear, conjunctiva pink Ears- hearing intact  Oropharynx- clear Neck- supple, no JVP Lymph- no cervical lymphadenopathy Lungs- Clear to ausculation bilaterally, normal work of breathing Heart- Regular rate and rhythm, no murmurs, rubs or gallops, PMI not laterally displaced GI- soft, NT, ND, + BS Extremities- no clubbing, cyanosis, or edema MS- no significant deformity or atrophy Skin- no rash or lesion Psych- euthymic mood, full affect Neuro- strength and sensation are intact  EKG-Sinus rhythm 80 bpm  Echo - Left ventricle: The cavity size was normal. There was mild focal basal hypertrophy of the septum. Systolic function was normal. The estimated ejection fraction was in the range of 50% to 55%. - Atrial septum: No defect or patent foramen  ovale was identified. - Impressions: Mitral inflow and PV doppler suggest sinus rhythm with diastolic relaxation abnormality.   Assessment and Plan:  1. Sinus tachycardia of unknown etiology.  Much improved with increased BB  No change in treatment.  2. Severe OSA Defer to primary care  3. HTN Stable No change required today I have encouraged adequate hydration during July and Aug  Follow-up with PCP and Dr Johnsie Cancel  I will see as needed going forward   Cynthia Grayer, MD 05/08/2015 11:12 AM   I have seen, examined the patient, and reviewed the above assessment and plan with Cynthia Palau NP.  Changes to above are made where necessary.  She is doing very well at this time.  Pleased with results of sleep apnea management.  Co Sign: Cynthia Grayer, MD 05/08/2015 11:12 AM

## 2015-05-08 NOTE — Patient Instructions (Signed)
Medication Instructions:  Your physician recommends that you continue on your current medications as directed. Please refer to the Current Medication list given to you today.   Labwork: None ordered   Testing/Procedures: None ordered   Follow-Up: Your physician recommends that you schedule a follow-up appointment as needed    Any Other Special Instructions Will Be Listed Below (If Applicable).   

## 2015-07-25 ENCOUNTER — Other Ambulatory Visit: Payer: Self-pay

## 2015-07-25 DIAGNOSIS — Z1231 Encounter for screening mammogram for malignant neoplasm of breast: Secondary | ICD-10-CM

## 2015-08-24 ENCOUNTER — Ambulatory Visit
Admission: RE | Admit: 2015-08-24 | Discharge: 2015-08-24 | Disposition: A | Discharge status: Home / Self Care | Payer: Medicare Other | Source: Ambulatory Visit

## 2015-08-24 DIAGNOSIS — Z1231 Encounter for screening mammogram for malignant neoplasm of breast: Secondary | ICD-10-CM

## 2015-12-27 IMAGING — MG MM SCREENING BREAST TOMO UNI R
4 series · 4 of 12 positions shown · non-contrast
Comparison: Previous exam(s).

CLINICAL DATA: Screening.

EXAM:
DIGITAL SCREENING UNILATERAL RIGHT MAMMOGRAM WITH TOMO AND CAD

[R CC]
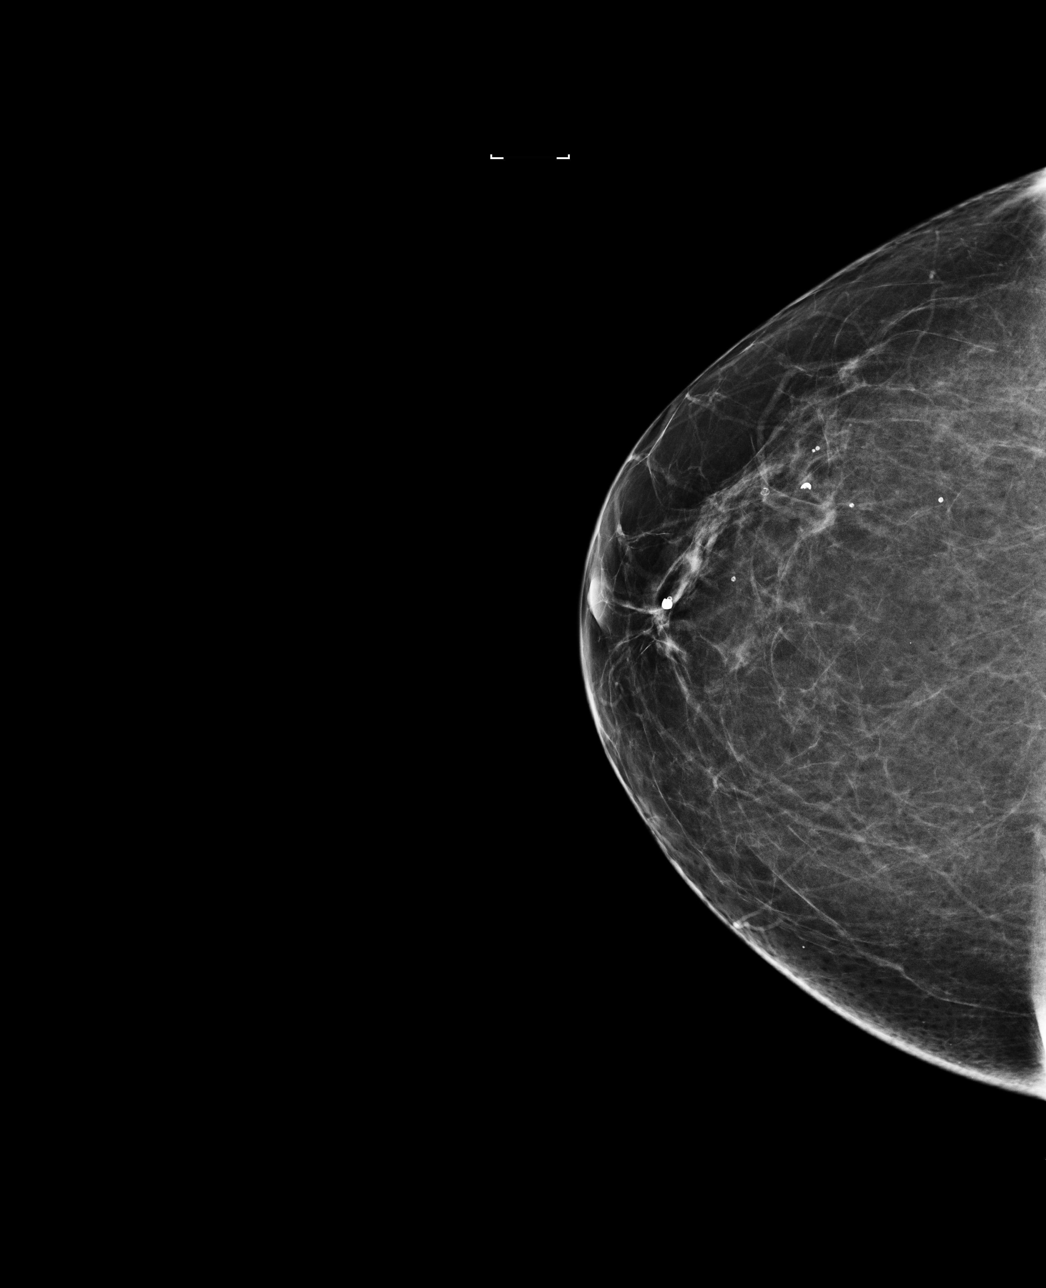

[R MLO]
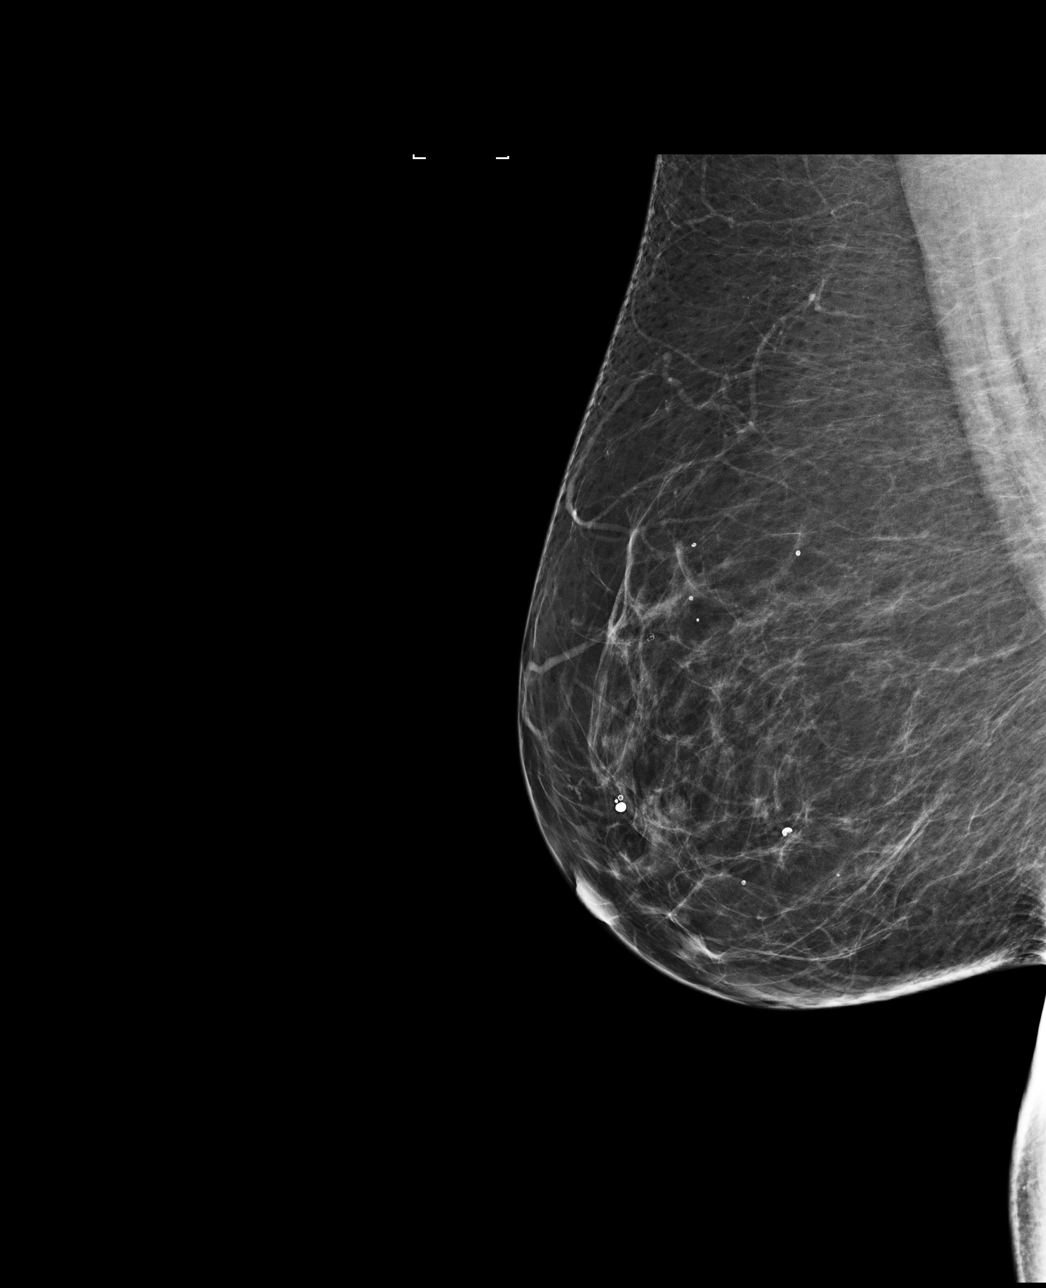

[R MLO tomo · tomo slice 39/76.0]
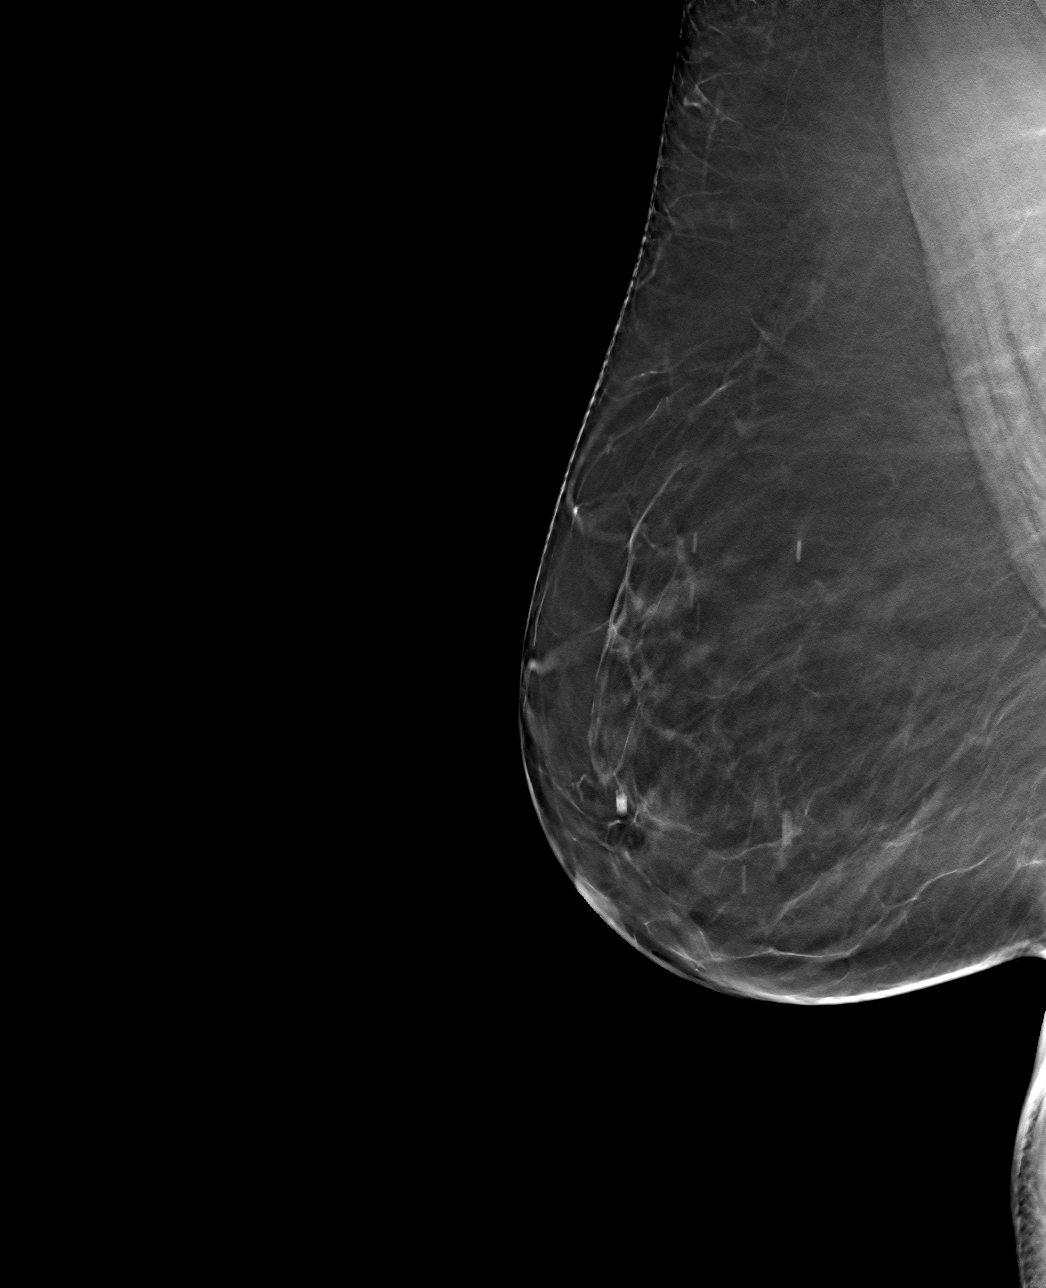

[R CC tomo · tomo slice 37/73.0]
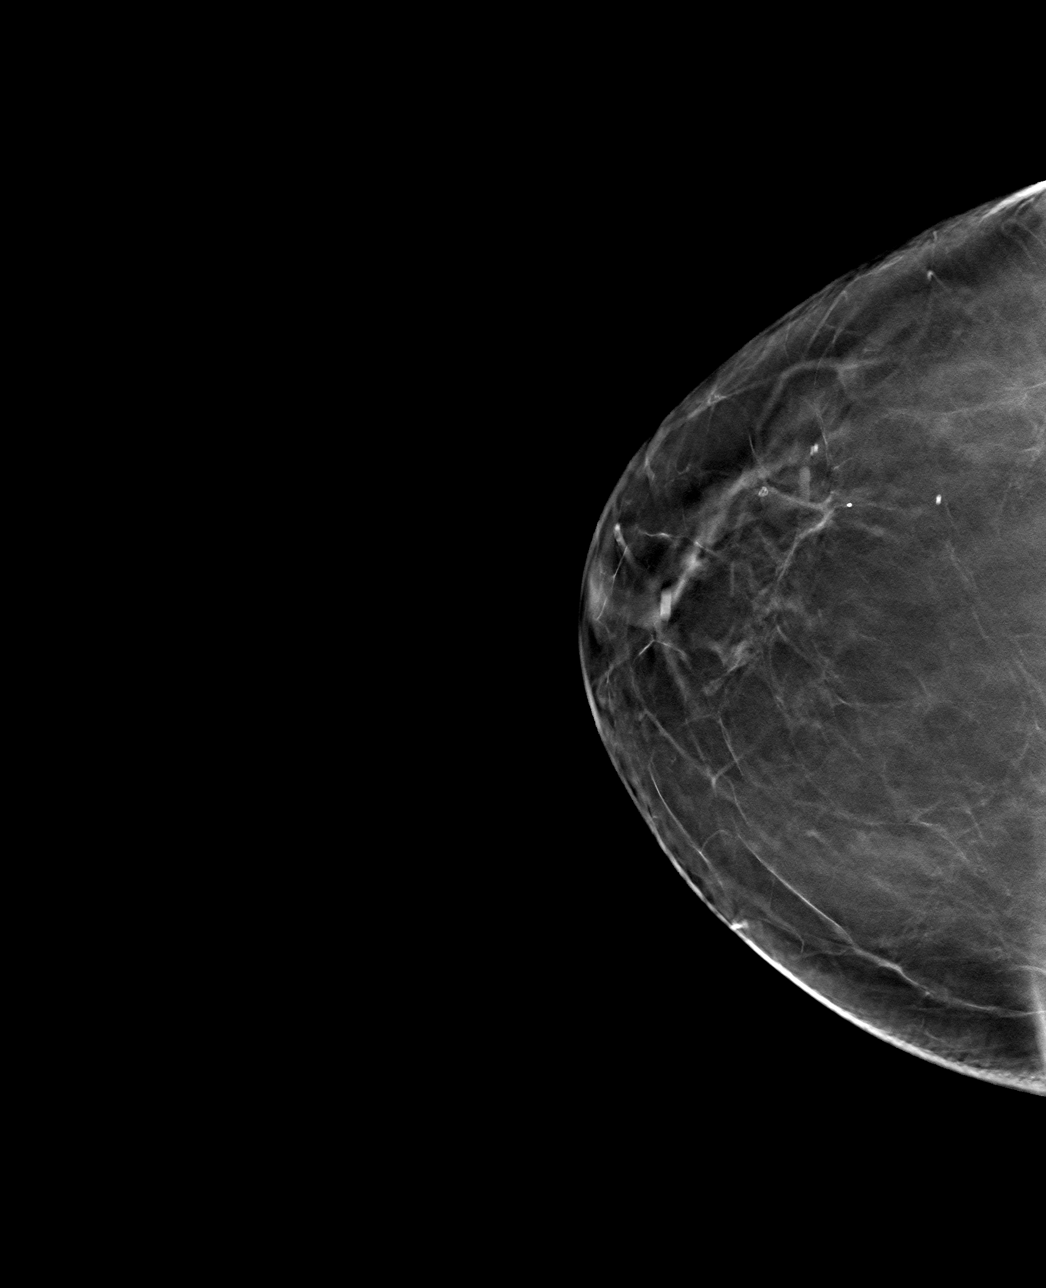

[4 of 12 positions shown; findings below may reference images not displayed]

ACR Breast Density Category b: There are scattered areas of
fibroglandular density.
FINDINGS: There are no findings suspicious for malignancy. Images were
processed with CAD.
IMPRESSION: No mammographic evidence of malignancy. A result letter of this
screening mammogram will be mailed directly to the patient.

RECOMMENDATION:
Screening mammogram in one year. (Code:TW-Z-NBD)

BI-RADS CATEGORY  1: Negative.

## 2016-07-29 ENCOUNTER — Other Ambulatory Visit: Payer: Self-pay | Admitting: Family Medicine

## 2016-07-29 DIAGNOSIS — Z1231 Encounter for screening mammogram for malignant neoplasm of breast: Secondary | ICD-10-CM

## 2016-08-26 ENCOUNTER — Ambulatory Visit: Payer: Medicare Other

## 2016-09-05 ENCOUNTER — Ambulatory Visit
Admission: RE | Admit: 2016-09-05 | Discharge: 2016-09-05 | Disposition: A | Discharge status: Home / Self Care | Payer: Medicare Other | Source: Ambulatory Visit | Attending: Family Medicine | Admitting: Family Medicine

## 2016-09-05 DIAGNOSIS — Z1231 Encounter for screening mammogram for malignant neoplasm of breast: Secondary | ICD-10-CM

## 2017-07-30 ENCOUNTER — Other Ambulatory Visit: Payer: Self-pay | Admitting: Family Medicine

## 2017-07-30 DIAGNOSIS — Z1231 Encounter for screening mammogram for malignant neoplasm of breast: Secondary | ICD-10-CM

## 2017-09-08 ENCOUNTER — Ambulatory Visit
Admission: RE | Admit: 2017-09-08 | Discharge: 2017-09-08 | Disposition: A | Discharge status: Home / Self Care | Payer: Medicare Other | Source: Ambulatory Visit | Attending: Family Medicine | Admitting: Family Medicine

## 2017-09-08 DIAGNOSIS — Z1231 Encounter for screening mammogram for malignant neoplasm of breast: Secondary | ICD-10-CM

## 2019-01-22 ENCOUNTER — Other Ambulatory Visit: Payer: Self-pay | Admitting: Family Medicine

## 2019-01-22 DIAGNOSIS — Z1231 Encounter for screening mammogram for malignant neoplasm of breast: Secondary | ICD-10-CM

## 2019-01-22 DIAGNOSIS — E2839 Other primary ovarian failure: Secondary | ICD-10-CM

## 2019-03-26 ENCOUNTER — Ambulatory Visit: Payer: Medicare Other

## 2019-03-26 ENCOUNTER — Other Ambulatory Visit: Payer: Medicare Other

## 2019-04-30 ENCOUNTER — Other Ambulatory Visit: Payer: Self-pay

## 2019-04-30 ENCOUNTER — Ambulatory Visit
Admission: RE | Admit: 2019-04-30 | Discharge: 2019-04-30 | Disposition: A | Discharge status: Home / Self Care | Payer: Medicare Other | Source: Ambulatory Visit | Attending: Family Medicine | Admitting: Family Medicine

## 2019-04-30 DIAGNOSIS — E2839 Other primary ovarian failure: Secondary | ICD-10-CM

## 2019-04-30 DIAGNOSIS — Z1231 Encounter for screening mammogram for malignant neoplasm of breast: Secondary | ICD-10-CM

## 2020-01-27 ENCOUNTER — Ambulatory Visit: Payer: Medicare Other | Attending: Internal Medicine

## 2020-01-27 DIAGNOSIS — Z23 Encounter for immunization: Secondary | ICD-10-CM

## 2020-01-27 NOTE — Progress Notes (Signed)
   Covid-19 Vaccination Clinic  Name:  IMA LAPIANA    MRN: [id number] DOB: 05/24/1946  01/27/2020  Ms. Saran was observed post Covid-19 immunization for 15 minutes without incident. She was provided with Vaccine Information Sheet and instruction to access the V-Safe system.   Ms. Mook was instructed to call 911 with any severe reactions post vaccine: Marland Kitchen Difficulty breathing  . Swelling of face and throat  . A fast heartbeat  . A bad rash all over body  . Dizziness and weakness   Immunizations Administered    Name Date Dose VIS Date Route   Pfizer COVID-19 Vaccine 01/27/2020  4:36 PM 0.3 mL 10/22/2019 Intramuscular   Manufacturer: [place]   Lot: [id number]   [place]: [id number]

## 2020-02-01 ENCOUNTER — Other Ambulatory Visit: Payer: Self-pay | Admitting: Family Medicine

## 2020-02-01 DIAGNOSIS — F17211 Nicotine dependence, cigarettes, in remission: Secondary | ICD-10-CM

## 2020-02-17 ENCOUNTER — Ambulatory Visit
Admission: RE | Admit: 2020-02-17 | Discharge: 2020-02-17 | Disposition: A | Discharge status: Home / Self Care | Payer: Medicare Other | Source: Ambulatory Visit | Attending: Family Medicine | Admitting: Family Medicine

## 2020-02-17 ENCOUNTER — Other Ambulatory Visit: Payer: Self-pay

## 2020-02-17 DIAGNOSIS — F17211 Nicotine dependence, cigarettes, in remission: Secondary | ICD-10-CM

## 2020-02-22 ENCOUNTER — Ambulatory Visit: Payer: Medicare Other | Attending: Internal Medicine

## 2020-02-22 DIAGNOSIS — Z23 Encounter for immunization: Secondary | ICD-10-CM

## 2020-02-22 NOTE — Progress Notes (Signed)
   Covid-19 Vaccination Clinic  Name:  Cynthia Garner    MRN: [id number] DOB: [birth year]  02/22/2020  [name] was observed post Covid-19 immunization for 15 minutes without incident. She was provided with Vaccine Information Sheet and instruction to access the V-Safe system.   [name] was instructed to call 911 with any severe reactions post vaccine: Marland Kitchen Difficulty breathing  . Swelling of face and throat  . A fast heartbeat  . A bad rash all over body  . Dizziness and weakness   Immunizations Administered    Name Date Dose VIS Date Route   Pfizer COVID-19 Vaccine 02/22/2020  4:11 PM 0.3 mL 10/22/2019 Intramuscular   Manufacturer: [place]   Lot: [id number]   [place]: [id number]

## 2020-03-01 ENCOUNTER — Other Ambulatory Visit: Payer: Self-pay

## 2020-03-01 ENCOUNTER — Ambulatory Visit: Payer: Medicare Other | Admitting: Cardiovascular Disease

## 2020-03-01 ENCOUNTER — Encounter: Payer: Self-pay | Admitting: Cardiovascular Disease

## 2020-03-01 DIAGNOSIS — E782 Mixed hyperlipidemia: Secondary | ICD-10-CM | POA: Diagnosis not present

## 2020-03-01 DIAGNOSIS — G4733 Obstructive sleep apnea (adult) (pediatric): Secondary | ICD-10-CM

## 2020-03-01 DIAGNOSIS — I10 Essential (primary) hypertension: Secondary | ICD-10-CM

## 2020-03-01 DIAGNOSIS — I2510 Atherosclerotic heart disease of native coronary artery without angina pectoris: Secondary | ICD-10-CM

## 2020-03-01 NOTE — Assessment & Plan Note (Signed)
Three-vessel coronary calcification seen on screening chest CT performed 02/18/2020.  Ongoing to get a coronary calcium score to further evaluate.

## 2020-03-01 NOTE — Assessment & Plan Note (Signed)
Appropriate forPatient hyperlipidemia on statin therapy performed by her PCP 01/31/2020 revealing total cholesterol of 129, LDL 69 and HDL of 39.

## 2020-03-01 NOTE — Progress Notes (Signed)
03/01/2020 Cynthia Garner   10-15-1946  [id number]  Primary Physician Jonathon Jordan, MD Primary Cardiologist: Lorretta Harp MD Renae Gloss  HPI:  Cynthia Garner is a 74 y.o. moderately overweight widowed Caucasian female mother of one living son (1 son deceased), grandmother to 1 grandchild referred by Dr. Jonathon Jordan for cardiovascular valuation because of calcium seen on chest CT.  She has seen Dr. Johnsie Cancel and Dr. Rayann Heman in the past for sinus tachycardia which no longer seems to be a problem.  Risk factors include 50 pack years of tobacco use having quit 7 years ago, treated hypertension and hyperlipidemia.  She is never had a heart attack or stroke.  She does complain of occasional dyspnea but denies chest pain.  She had a screening chest CT done 02/17/2020 revealing three-vessel coronary calcification.   Current Meds  Medication Sig  . aspirin 81 MG tablet Take 81 mg by mouth daily.  Marland Kitchen levothyroxine (SYNTHROID, LEVOTHROID) 88 MCG tablet Take 88 mcg by mouth daily before breakfast.  . lisinopril-hydrochlorothiazide (PRINZIDE,ZESTORETIC) 20-12.5 MG per tablet Take 1 tablet by mouth daily.  . metoprolol succinate (TOPROL-XL) 25 MG 24 hr tablet Take one (25 mg) tablet along with one (50 mg) tablet by mouth daily to equal 75 mg daily. Take with or immediately following a meal.  . metoprolol succinate (TOPROL-XL) 50 MG 24 hr tablet Take one (25 mg) tablet along with one (50 mg) tablet by mouth daily to equal 75 mg daily. Take with or immediately following a meal.  . simvastatin (ZOCOR) 20 MG tablet Take 20 mg by mouth every morning.   . traZODone (DESYREL) 50 MG tablet Take 100 mg by mouth at bedtime.      No Known Allergies  Social History   Socioeconomic History  . Marital status: Widowed    Spouse name: Not on file  . Number of children: Not on file  . Years of education: Not on file  . Highest education level: Not on file  Occupational History  . Not on file    Tobacco Use  . Smoking status: Former Research scientist (life sciences)  . Smokeless tobacco: Never Used  Substance and Sexual Activity  . Alcohol use: Never  . Drug use: Never  . Sexual activity: Not Currently  Other Topics Concern  . Not on file  Social History Narrative  . Not on file   Social Determinants of Health   Financial Resource Strain:   . Difficulty of Paying Living Expenses:   Food Insecurity:   . Worried About Charity fundraiser in the Last Year:   . Arboriculturist in the Last Year:   Transportation Needs:   . Film/video editor (Medical):   Marland Kitchen Lack of Transportation (Non-Medical):   Physical Activity:   . Days of Exercise per Week:   . Minutes of Exercise per Session:   Stress:   . Feeling of Stress :   Social Connections:   . Frequency of Communication with Friends and Family:   . Frequency of Social Gatherings with Friends and Family:   . Attends Religious Services:   . Active Member of Clubs or Organizations:   . Attends Archivist Meetings:   Marland Kitchen Marital Status:   Intimate Partner Violence:   . Fear of Current or Ex-Partner:   . Emotionally Abused:   Marland Kitchen Physically Abused:   . Sexually Abused:      Review of Systems: General: negative for chills,  fever, night sweats or weight changes.  Cardiovascular: negative for chest pain, dyspnea on exertion, edema, orthopnea, palpitations, paroxysmal nocturnal dyspnea or shortness of breath Dermatological: negative for rash Respiratory: negative for cough or wheezing Urologic: negative for hematuria Abdominal: negative for nausea, vomiting, diarrhea, bright red blood per rectum, melena, or hematemesis Neurologic: negative for visual changes, syncope, or dizziness All other systems reviewed and are otherwise negative except as noted above.    Blood pressure 134/68, pulse 73, height 4\' 10"  (1.473 m), weight 142 lb (64.4 kg), SpO2 96 %.  General appearance: alert and no distress Neck: no adenopathy, no carotid bruit, no  JVD, supple, symmetrical, trachea midline and thyroid not enlarged, symmetric, no tenderness/mass/nodules Lungs: clear to auscultation bilaterally Heart: regular rate and rhythm, S1, S2 normal, no murmur, click, rub or gallop Extremities: extremities normal, atraumatic, no cyanosis or edema Pulses: 2+ and symmetric Skin: Skin color, texture, turgor normal. No rashes or lesions Neurologic: Alert and oriented X 3, normal strength and tone. Normal symmetric reflexes. Normal coordination and gait  EKG sinus rhythm at 66 with nonspecific ST and T wave changes.  Personally reviewed this EKG.  ASSESSMENT AND PLAN:   Hyperlipidemia Appropriate forPatient hyperlipidemia on statin therapy performed by her PCP 01/31/2020 revealing total cholesterol of 129, LDL 69 and HDL of 39.  Obstructive sleep apnea History of obstructive sleep apnea on CPAP  Essential hypertension History of essential hypertension blood pressure measured today 134/68.  She is on lisinopril, hydrochlorothiazide and metoprolol.  Coronary artery calcification seen on CT scan Three-vessel coronary calcification seen on screening chest CT performed 02/18/2020.  Ongoing to get a coronary calcium score to further evaluate.      Lorretta Harp MD FACP,FACC,FAHA, [facility] 03/01/2020 12:09 PM

## 2020-03-01 NOTE — Assessment & Plan Note (Signed)
History of obstructive sleep apnea on CPAP. 

## 2020-03-01 NOTE — Patient Instructions (Signed)
Medication Instructions:  Your physician recommends that you continue on your current medications as directed. Please refer to the Current Medication list given to you today.  *If you need a refill on your cardiac medications before your next appointment, please call your pharmacy*  Testing/Procedures: CT coronary calcium score. This test is done at [address]. This is $150 out of pocket.   Coronary CalciumScan A coronary calcium scan is an imaging test used to look for deposits of calcium and other fatty materials (plaques) in the inner lining of the blood vessels of the heart (coronary arteries). These deposits of calcium and plaques can partly clog and narrow the coronary arteries without producing any symptoms or warning signs. This puts a person at risk for a heart attack. This test can detect these deposits before symptoms develop. Tell a health care provider about:  Any allergies you have.  All medicines you are taking, including vitamins, herbs, eye drops, creams, and over-the-counter medicines.  Any problems you or family members have had with anesthetic medicines.  Any blood disorders you have.  Any surgeries you have had.  Any medical conditions you have.  Whether you are pregnant or may be pregnant. What are the risks? Generally, this is a safe procedure. However, problems may occur, including:  Harm to a pregnant woman and her unborn baby. This test involves the use of radiation. Radiation exposure can be dangerous to a pregnant woman and her unborn baby. If you are pregnant, you generally should not have this procedure done.  Slight increase in the risk of cancer. This is because of the radiation involved in the test. What happens before the procedure? No preparation is needed for this procedure. What happens during the procedure?  You will undress and remove any jewelry around your neck or chest.  You will put on a hospital gown.  Sticky  electrodes will be placed on your chest. The electrodes will be connected to an electrocardiogram (ECG) machine to record a tracing of the electrical activity of your heart.  A CT scanner will take pictures of your heart. During this time, you will be asked to lie still and hold your breath for 2-3 seconds while a picture of your heart is being taken. The procedure may vary among health care providers and hospitals. What happens after the procedure?  You can get dressed.  You can return to your normal activities.  It is up to you to get the results of your test. Ask your health care provider, or the department that is doing the test, when your results will be ready. Summary  A coronary calcium scan is an imaging test used to look for deposits of calcium and other fatty materials (plaques) in the inner lining of the blood vessels of the heart (coronary arteries).  Generally, this is a safe procedure. Tell your health care provider if you are pregnant or may be pregnant.  No preparation is needed for this procedure.  A CT scanner will take pictures of your heart.  You can return to your normal activities after the scan is done. This information is not intended to replace advice given to you by your health care provider. Make sure you discuss any questions you have with your health care provider. Document Released: 04/25/2008 Document Revised: 09/16/2016 Document Reviewed: 09/16/2016 Elsevier Interactive Patient Education  2017 [place]: At [facility], you and your health needs are our priority.  As part of our continuing  mission to provide you with exceptional heart care, we have created designated Provider Care Teams.  These Care Teams include your primary Cardiologist (physician) and Advanced Practice Providers (APPs -  Physician Assistants and Nurse Practitioners) who all work together to provide you with the care you need, when you need it.  We recommend signing up  for the patient portal called "MyChart".  Sign up information is provided on this After Visit Summary.  MyChart is used to connect with patients for Virtual Visits (Telemedicine).  Patients are able to view lab/test results, encounter notes, upcoming appointments, etc.  Non-urgent messages can be sent to your provider as well.   To learn more about what you can do with MyChart, go to [email or web address].    Your next appointment:   12 month(s)  The format for your next appointment:   In Person  Provider:   Quay Burow, MD

## 2020-03-01 NOTE — Assessment & Plan Note (Signed)
History of essential hypertension blood pressure measured today 134/68.  She is on lisinopril, hydrochlorothiazide and metoprolol.

## 2020-03-08 ENCOUNTER — Other Ambulatory Visit: Payer: Self-pay

## 2020-03-08 ENCOUNTER — Ambulatory Visit (INDEPENDENT_AMBULATORY_CARE_PROVIDER_SITE_OTHER)
Admission: RE | Admit: 2020-03-08 | Discharge: 2020-03-08 | Disposition: A | Discharge status: Home / Self Care | Payer: Self-pay | Source: Ambulatory Visit | Attending: Cardiovascular Disease | Admitting: Cardiovascular Disease

## 2020-03-08 DIAGNOSIS — E782 Mixed hyperlipidemia: Secondary | ICD-10-CM

## 2020-03-14 NOTE — Addendum Note (Signed)
Addended by: Wonda Horner on: 03/14/2020 03:06 PM   Modules accepted: Orders

## 2020-12-08 DIAGNOSIS — H524 Presbyopia: Secondary | ICD-10-CM | POA: Diagnosis not present

## 2020-12-08 DIAGNOSIS — H04123 Dry eye syndrome of bilateral lacrimal glands: Secondary | ICD-10-CM | POA: Diagnosis not present

## 2020-12-08 DIAGNOSIS — H35372 Puckering of macula, left eye: Secondary | ICD-10-CM | POA: Diagnosis not present

## 2021-02-02 DIAGNOSIS — N3281 Overactive bladder: Secondary | ICD-10-CM | POA: Diagnosis not present

## 2021-02-02 DIAGNOSIS — I700 Atherosclerosis of aorta: Secondary | ICD-10-CM | POA: Diagnosis not present

## 2021-02-02 DIAGNOSIS — N183 Chronic kidney disease, stage 3 unspecified: Secondary | ICD-10-CM | POA: Diagnosis not present

## 2021-02-02 DIAGNOSIS — R7303 Prediabetes: Secondary | ICD-10-CM | POA: Diagnosis not present

## 2021-02-02 DIAGNOSIS — M899 Disorder of bone, unspecified: Secondary | ICD-10-CM | POA: Diagnosis not present

## 2021-02-02 DIAGNOSIS — G4733 Obstructive sleep apnea (adult) (pediatric): Secondary | ICD-10-CM | POA: Diagnosis not present

## 2021-02-02 DIAGNOSIS — Z79899 Other long term (current) drug therapy: Secondary | ICD-10-CM | POA: Diagnosis not present

## 2021-02-02 DIAGNOSIS — Z0000 Encounter for general adult medical examination without abnormal findings: Secondary | ICD-10-CM | POA: Diagnosis not present

## 2021-02-02 DIAGNOSIS — E7800 Pure hypercholesterolemia, unspecified: Secondary | ICD-10-CM | POA: Diagnosis not present

## 2021-02-02 DIAGNOSIS — E039 Hypothyroidism, unspecified: Secondary | ICD-10-CM | POA: Diagnosis not present

## 2021-02-02 DIAGNOSIS — I10 Essential (primary) hypertension: Secondary | ICD-10-CM | POA: Diagnosis not present

## 2021-02-02 DIAGNOSIS — E559 Vitamin D deficiency, unspecified: Secondary | ICD-10-CM | POA: Diagnosis not present

## 2021-03-07 ENCOUNTER — Other Ambulatory Visit: Payer: Self-pay

## 2021-03-07 ENCOUNTER — Ambulatory Visit: Payer: Medicare Other | Admitting: Cardiovascular Disease

## 2021-03-07 ENCOUNTER — Encounter: Payer: Self-pay | Admitting: Cardiovascular Disease

## 2021-03-07 VITALS — BP 134/62 | HR 64 | Ht <= 58 in | Wt 147.0 lb

## 2021-03-07 DIAGNOSIS — I10 Essential (primary) hypertension: Secondary | ICD-10-CM

## 2021-03-07 DIAGNOSIS — E782 Mixed hyperlipidemia: Secondary | ICD-10-CM

## 2021-03-07 DIAGNOSIS — I2510 Atherosclerotic heart disease of native coronary artery without angina pectoris: Secondary | ICD-10-CM

## 2021-03-07 NOTE — Assessment & Plan Note (Signed)
History of essential hypertension blood pressure measured today 134/62.  She is on lisinopril, hydrochlorothiazide and metoprolol.

## 2021-03-07 NOTE — Assessment & Plan Note (Signed)
History of hyperlipidemia on statin therapy with lipid profile performed 02/02/2021 revealing total cholesterol of 128, LDL of 67 and HDL of 39.

## 2021-03-07 NOTE — Patient Instructions (Signed)

## 2021-03-07 NOTE — Assessment & Plan Note (Signed)
History of coronary calcification seen on chest CT with subsequent coronary calcium score performed 03/08/2020 at 451.  Based on this, and in the absence of symptoms.  It was decided to pursue aggressive with risk factor modification with treatment of her hyperlipidemia successfully.

## 2021-03-07 NOTE — Progress Notes (Signed)
03/07/2021 Cynthia Garner   January 07, 1946  [id number]  Primary Physician Jonathon Jordan, MD Primary Cardiologist: Lorretta Harp MD Lupe Carney, [place]  HPI:  Cynthia Garner is a 75 y.o. moderately overweight widowed Caucasian female mother of one living son (1 son deceased), grandmother to 1 grandchild referred by Dr. Jonathon Jordan for cardiovascular valuation because of calcium seen on chest CT.  She has seen Dr. Johnsie Cancel and Dr. Rayann Heman in the past for sinus tachycardia which no longer seems to be a problem.  I last saw her in the office 03/01/2020. Risk factors include 50 pack years of tobacco use having quit 7 years ago, treated hypertension and hyperlipidemia.  She is never had a heart attack or stroke.  She does complain of occasional dyspnea but denies chest pain.  She had a screening chest CT done 02/17/2020 revealing three-vessel coronary calcification.  I did get a coronary calcium score on her 03/08/2020 which was 451.  Her most recent lipid profile revealed an LDL of 67.  She is completely asymptomatic.   Current Meds  Medication Sig  . aspirin 81 MG tablet Take 81 mg by mouth daily.  Marland Kitchen levothyroxine (SYNTHROID, LEVOTHROID) 88 MCG tablet Take 88 mcg by mouth daily before breakfast.  . lisinopril-hydrochlorothiazide (PRINZIDE,ZESTORETIC) 20-12.5 MG per tablet Take 1 tablet by mouth daily.  . metoprolol succinate (TOPROL-XL) 25 MG 24 hr tablet Take one (25 mg) tablet along with one (50 mg) tablet by mouth daily to equal 75 mg daily. Take with or immediately following a meal.  . metoprolol succinate (TOPROL-XL) 50 MG 24 hr tablet Take one (25 mg) tablet along with one (50 mg) tablet by mouth daily to equal 75 mg daily. Take with or immediately following a meal.  . simvastatin (ZOCOR) 20 MG tablet Take 20 mg by mouth every morning.   . traZODone (DESYREL) 50 MG tablet Take 100 mg by mouth at bedtime.      No Known Allergies  Social History   Socioeconomic History  . Marital status:  Widowed    Spouse name: Not on file  . Number of children: Not on file  . Years of education: Not on file  . Highest education level: Not on file  Occupational History  . Not on file  Tobacco Use  . Smoking status: Former Research scientist (life sciences)  . Smokeless tobacco: Never Used  Substance and Sexual Activity  . Alcohol use: Never  . Drug use: Never  . Sexual activity: Not Currently  Other Topics Concern  . Not on file  Social History Narrative  . Not on file   Social Determinants of Health   Financial Resource Strain: Not on file  Food Insecurity: Not on file  Transportation Needs: Not on file  Physical Activity: Not on file  Stress: Not on file  Social Connections: Not on file  Intimate Partner Violence: Not on file     Review of Systems: General: negative for chills, fever, night sweats or weight changes.  Cardiovascular: negative for chest pain, dyspnea on exertion, edema, orthopnea, palpitations, paroxysmal nocturnal dyspnea or shortness of breath Dermatological: negative for rash Respiratory: negative for cough or wheezing Urologic: negative for hematuria Abdominal: negative for nausea, vomiting, diarrhea, bright red blood per rectum, melena, or hematemesis Neurologic: negative for visual changes, syncope, or dizziness All other systems reviewed and are otherwise negative except as noted above.    Blood pressure 134/62, pulse 64, height 4\' 10"  (1.473 m), weight 147 lb (66.7  kg).  General appearance: alert and no distress Neck: no adenopathy, no carotid bruit, no JVD, supple, symmetrical, trachea midline and thyroid not enlarged, symmetric, no tenderness/mass/nodules Lungs: clear to auscultation bilaterally Heart: regular rate and rhythm, S1, S2 normal, no murmur, click, rub or gallop Extremities: extremities normal, atraumatic, no cyanosis or edema Pulses: 2+ and symmetric Skin: Skin color, texture, turgor normal. No rashes or lesions Neurologic: Alert and oriented X 3, normal  strength and tone. Normal symmetric reflexes. Normal coordination and gait  EKG sinus rhythm at 64 without ST or T wave changes.  I personally reviewed this EKG.  ASSESSMENT AND PLAN:   Hyperlipidemia History of hyperlipidemia on statin therapy with lipid profile performed 02/02/2021 revealing total cholesterol of 128, LDL of 67 and HDL of 39.  Essential hypertension History of essential hypertension blood pressure measured today 134/62.  She is on lisinopril, hydrochlorothiazide and metoprolol.  Coronary artery calcification seen on CT scan History of coronary calcification seen on chest CT with subsequent coronary calcium score performed 03/08/2020 at 451.  Based on this, and in the absence of symptoms.  It was decided to pursue aggressive with risk factor modification with treatment of her hyperlipidemia successfully.      Lorretta Harp MD FACP,FACC,FAHA, [facility] 03/07/2021 2:56 PM

## 2021-05-03 DIAGNOSIS — C44311 Basal cell carcinoma of skin of nose: Secondary | ICD-10-CM | POA: Diagnosis not present

## 2021-05-03 DIAGNOSIS — C44319 Basal cell carcinoma of skin of other parts of face: Secondary | ICD-10-CM | POA: Diagnosis not present

## 2021-05-03 DIAGNOSIS — C441191 Basal cell carcinoma of skin of left upper eyelid, including canthus: Secondary | ICD-10-CM | POA: Diagnosis not present

## 2021-06-07 DIAGNOSIS — C441191 Basal cell carcinoma of skin of left upper eyelid, including canthus: Secondary | ICD-10-CM | POA: Diagnosis not present

## 2021-06-07 DIAGNOSIS — C44311 Basal cell carcinoma of skin of nose: Secondary | ICD-10-CM | POA: Diagnosis not present

## 2021-09-03 DIAGNOSIS — L738 Other specified follicular disorders: Secondary | ICD-10-CM | POA: Diagnosis not present

## 2021-09-03 DIAGNOSIS — D1801 Hemangioma of skin and subcutaneous tissue: Secondary | ICD-10-CM | POA: Diagnosis not present

## 2021-09-03 DIAGNOSIS — Z85828 Personal history of other malignant neoplasm of skin: Secondary | ICD-10-CM | POA: Diagnosis not present

## 2021-09-03 DIAGNOSIS — D692 Other nonthrombocytopenic purpura: Secondary | ICD-10-CM | POA: Diagnosis not present

## 2021-09-03 DIAGNOSIS — L245 Irritant contact dermatitis due to other chemical products: Secondary | ICD-10-CM | POA: Diagnosis not present

## 2021-09-03 DIAGNOSIS — L814 Other melanin hyperpigmentation: Secondary | ICD-10-CM | POA: Diagnosis not present

## 2021-09-03 DIAGNOSIS — L820 Inflamed seborrheic keratosis: Secondary | ICD-10-CM | POA: Diagnosis not present

## 2021-09-18 DIAGNOSIS — C50912 Malignant neoplasm of unspecified site of left female breast: Secondary | ICD-10-CM | POA: Diagnosis not present

## 2021-12-18 DIAGNOSIS — H524 Presbyopia: Secondary | ICD-10-CM | POA: Diagnosis not present

## 2021-12-18 DIAGNOSIS — Z961 Presence of intraocular lens: Secondary | ICD-10-CM | POA: Diagnosis not present

## 2022-02-08 DIAGNOSIS — D692 Other nonthrombocytopenic purpura: Secondary | ICD-10-CM | POA: Diagnosis not present

## 2022-02-08 DIAGNOSIS — I10 Essential (primary) hypertension: Secondary | ICD-10-CM | POA: Diagnosis not present

## 2022-02-08 DIAGNOSIS — E559 Vitamin D deficiency, unspecified: Secondary | ICD-10-CM | POA: Diagnosis not present

## 2022-02-08 DIAGNOSIS — I422 Other hypertrophic cardiomyopathy: Secondary | ICD-10-CM | POA: Diagnosis not present

## 2022-02-08 DIAGNOSIS — R7303 Prediabetes: Secondary | ICD-10-CM | POA: Diagnosis not present

## 2022-02-08 DIAGNOSIS — Z0000 Encounter for general adult medical examination without abnormal findings: Secondary | ICD-10-CM | POA: Diagnosis not present

## 2022-02-08 DIAGNOSIS — Z79899 Other long term (current) drug therapy: Secondary | ICD-10-CM | POA: Diagnosis not present

## 2022-02-08 DIAGNOSIS — E7800 Pure hypercholesterolemia, unspecified: Secondary | ICD-10-CM | POA: Diagnosis not present

## 2022-02-08 DIAGNOSIS — I700 Atherosclerosis of aorta: Secondary | ICD-10-CM | POA: Diagnosis not present

## 2022-02-08 DIAGNOSIS — Z853 Personal history of malignant neoplasm of breast: Secondary | ICD-10-CM | POA: Diagnosis not present

## 2022-02-08 DIAGNOSIS — E039 Hypothyroidism, unspecified: Secondary | ICD-10-CM | POA: Diagnosis not present

## 2022-02-11 ENCOUNTER — Other Ambulatory Visit: Payer: Self-pay | Admitting: Family Medicine

## 2022-02-11 DIAGNOSIS — E2839 Other primary ovarian failure: Secondary | ICD-10-CM

## 2022-02-12 ENCOUNTER — Other Ambulatory Visit: Payer: Self-pay | Admitting: Family Medicine

## 2022-02-12 DIAGNOSIS — Z1231 Encounter for screening mammogram for malignant neoplasm of breast: Secondary | ICD-10-CM

## 2022-02-15 ENCOUNTER — Ambulatory Visit
Admission: RE | Admit: 2022-02-15 | Discharge: 2022-02-15 | Disposition: A | Discharge status: Home / Self Care | Payer: Medicare Other | Source: Ambulatory Visit | Attending: Family Medicine | Admitting: Family Medicine

## 2022-02-15 DIAGNOSIS — Z1231 Encounter for screening mammogram for malignant neoplasm of breast: Secondary | ICD-10-CM

## 2022-02-15 DIAGNOSIS — E2839 Other primary ovarian failure: Secondary | ICD-10-CM

## 2022-02-15 DIAGNOSIS — Z780 Asymptomatic menopausal state: Secondary | ICD-10-CM | POA: Diagnosis not present

## 2022-03-27 DIAGNOSIS — E039 Hypothyroidism, unspecified: Secondary | ICD-10-CM | POA: Diagnosis not present

## 2022-03-27 DIAGNOSIS — N183 Chronic kidney disease, stage 3 unspecified: Secondary | ICD-10-CM | POA: Diagnosis not present

## 2022-05-22 ENCOUNTER — Ambulatory Visit: Payer: Medicare Other | Admitting: Nurse Practitioner

## 2022-05-22 ENCOUNTER — Encounter: Payer: Self-pay | Admitting: Nurse Practitioner

## 2022-05-22 VITALS — BP 110/62 | HR 76 | Ht <= 58 in | Wt 135.4 lb

## 2022-05-22 DIAGNOSIS — I2510 Atherosclerotic heart disease of native coronary artery without angina pectoris: Secondary | ICD-10-CM | POA: Diagnosis not present

## 2022-05-22 DIAGNOSIS — E785 Hyperlipidemia, unspecified: Secondary | ICD-10-CM

## 2022-05-22 DIAGNOSIS — G4733 Obstructive sleep apnea (adult) (pediatric): Secondary | ICD-10-CM

## 2022-05-22 DIAGNOSIS — I10 Essential (primary) hypertension: Secondary | ICD-10-CM | POA: Diagnosis not present

## 2022-05-22 NOTE — Patient Instructions (Signed)
Medication Instructions:  Your physician recommends that you continue on your current medications as directed. Please refer to the Current Medication list given to you today.   *If you need a refill on your cardiac medications before your next appointment, please call your pharmacy*   Lab Work: NONE ordered at this time of appointment   If you have labs (blood work) drawn today and your tests are completely normal, you will receive your results only by: [place] (if you have MyChart) OR A paper copy in the mail If you have any lab test that is abnormal or we need to change your treatment, we will call you to review the results.   Testing/Procedures: NONE ordered at this time of appointment     Follow-Up: At [facility], you and your health needs are our priority.  As part of our continuing mission to provide you with exceptional heart care, we have created designated Provider Care Teams.  These Care Teams include your primary Cardiologist (physician) and Advanced Practice Providers (APPs -  Physician Assistants and Nurse Practitioners) who all work together to provide you with the care you need, when you need it.  We recommend signing up for the patient portal called "MyChart".  Sign up information is provided on this After Visit Summary.  MyChart is used to connect with patients for Virtual Visits (Telemedicine).  Patients are able to view lab/test results, encounter notes, upcoming appointments, etc.  Non-urgent messages can be sent to your provider as well.   To learn more about what you can do with MyChart, go to [email or web address].    Your next appointment:   1 year(s)  The format for your next appointment:   In Person  Provider:   Quay Burow, MD     Other Instructions   Important Information About Sugar

## 2022-05-22 NOTE — Progress Notes (Signed)
Office Visit    Patient Name: Cynthia Garner Date of Encounter: 05/22/2022  Primary Care Provider:  Jonathon Jordan, MD Primary Cardiologist:  Quay Burow, MD  Chief Complaint    76 year old female with a history of coronary artery calcification on CT, hypertension, hyperlipidemia, breast cancer s/p chemo, radiation, left breast mastectomy, OSA, and hypothyroidism who presents for follow-up related to hypertension and hyperlipidemia.  Past Medical History    Past Medical History:  Diagnosis Date   Breast cancer ([place])    s/p left breast mastectomy; s/p adjuvant chemo; and radiation; and hormonnal therapy   Hyperlipidemia    Hypertension    Hypothyroidism    Leukocytosis    Obstructive sleep apnea    severe, followed by Dr Maxwell Caul   Tachycardia    Vitamin D deficiency    Past Surgical History:  Procedure Laterality Date   CARDIOVERSION N/A 08/30/2014   Procedure: CARDIOVERSION;  Surgeon: Josue Hector, MD;  Location: [facility] CATH LAB;  Service: Cardiovascular;  Laterality: N/A;   left mastectomy     MASTECTOMY Left    malignant    Allergies  No Known Allergies  History of Present Illness    76 year old female with the above past medical history including coronary artery calcification on CT, hypertension, hyperlipidemia, breast cancer s/p chemo, radiation, left breast mastectomy, OSA, and hypothyroidism.  She was previously followed by Dr. Johnsie Cancel and Dr. Rayann Heman for sinus tachycardia, since resolved.  She later established with Dr. Gwenlyn Found in the setting of calcium seen on chest CT.  Chest CT for lung cancer screening in April 2021 revealed three-vessel coronary calcification.  Subsequent coronary calcium score was 451.  Aggressive risk factor modification including treatment of hyperlipidemia. She was last seen in the office on 03/07/2021 and was stable from a cardiac standpoint.  She denied symptoms concerning for angina.    She presents today for follow-up. Since her last  visit she has been stable from a cardiac standpoint.  She does note occasional mild dyspnea with significant exertion.  She thinks this is mostly due to overall physical deconditioning. She denies any additional symptoms concerning for angina. She also notes occasional lightheadedness with positional changes. Denies presyncope, syncope. Overall, she reports feeling well and denies any additional concerns today.   Home Medications    Current Outpatient Medications  Medication Sig Dispense Refill   aspirin 81 MG tablet Take 81 mg by mouth daily.     levothyroxine (SYNTHROID, LEVOTHROID) 88 MCG tablet Take 88 mcg by mouth daily before breakfast.     lisinopril-hydrochlorothiazide (PRINZIDE,ZESTORETIC) 20-12.5 MG per tablet Take 1 tablet by mouth daily.     metoprolol succinate (TOPROL-XL) 50 MG 24 hr tablet Take one (25 mg) tablet along with one (50 mg) tablet by mouth daily to equal 75 mg daily. Take with or immediately following a meal.     simvastatin (ZOCOR) 20 MG tablet Take 20 mg by mouth every morning.      traZODone (DESYREL) 50 MG tablet Take 100 mg by mouth at bedtime.      No current facility-administered medications for this visit.     Review of Systems    She denies chest pain, palpitations, pnd, orthopnea, n, v, dizziness, syncope, edema, weight gain, or early satiety. All other systems reviewed and are otherwise negative except as noted above.   Physical Exam    VS:  BP 110/62   Pulse 76   Ht '4\' 10"'$  (1.473 m)   Wt 135 lb 6.4  oz (61.4 kg)   SpO2 94%   BMI 28.30 kg/m   GEN: Well nourished, well developed, in no acute distress. HEENT: normal. Neck: Supple, no JVD, carotid bruits, or masses. Cardiac: RRR, no murmurs, rubs, or gallops. No clubbing, cyanosis, edema.  Radials/DP/PT 2+ and equal bilaterally.  Respiratory:  Respirations regular and unlabored, clear to auscultation bilaterally. GI: Soft, nontender, nondistended, BS + x 4. MS: no deformity or atrophy. Skin: warm  and dry, no rash. Neuro:  Strength and sensation are intact. Psych: Normal affect.  Accessory Clinical Findings    ECG personally reviewed by me today -NSR, 76 bpm.  Lab Results  Component Value Date   WBC 10.6 (H) 08/29/2014   HGB 13.7 08/29/2014   HCT 42.1 08/29/2014   MCV 90.5 08/29/2014   PLT 402.0 (H) 08/29/2014   Lab Results  Component Value Date   CREATININE 1.0 08/29/2014   BUN 13 08/29/2014   NA 138 08/29/2014   K 4.2 08/29/2014   CL 99 08/29/2014   CO2 31 08/29/2014   Lab Results  Component Value Date   ALT 11 03/23/2012   AST 18 03/23/2012   ALKPHOS 95 03/23/2012   BILITOT 0.4 03/23/2012   No results found for: "CHOL", "HDL", "LDLCALC", "LDLDIRECT", "TRIG", "CHOLHDL"  No results found for: "HGBA1C"  Assessment & Plan    1. Coronary artery calcification on CT scan: Coronary calcium score was 451. Aggressive  risk factor modification was advised.  She does note occasional mild dyspnea on exertion, ever, she thinks this is likely related to overall physical deconditioning as she is not very active at baseline. She denies any other symptoms concerning for angina. Discussed possible ischemic evaluation, however, she declines at this time.  We will continue to monitor symptoms.  Continue aspirin, lisinopril- hydrochlorothiazide, metoprolol, and simvastatin.   2. Hypertension: BP well controlled, runs a little low at baseline, however, she is overall asymptomatic.  She does note occasional lightheadedness with positional changes.  She denies presyncope, syncope.  Continue current antihypertensive regimen.   3. Hyperlipidemia: LDL was 65 in March 2023.  Continue aspirin, simvastatin.  4. OSA: Not on CPAP.  She does not reconsider CPAP at this time.  5. Disposition: Follow-up in 1 year.  Lenna Sciara, NP 05/22/2022, 4:26 PM

## 2022-08-27 DIAGNOSIS — I10 Essential (primary) hypertension: Secondary | ICD-10-CM | POA: Diagnosis not present

## 2022-08-27 DIAGNOSIS — E039 Hypothyroidism, unspecified: Secondary | ICD-10-CM | POA: Diagnosis not present

## 2022-08-27 DIAGNOSIS — R7303 Prediabetes: Secondary | ICD-10-CM | POA: Diagnosis not present

## 2022-12-24 DIAGNOSIS — Z961 Presence of intraocular lens: Secondary | ICD-10-CM | POA: Diagnosis not present

## 2022-12-24 DIAGNOSIS — H0015 Chalazion left lower eyelid: Secondary | ICD-10-CM | POA: Diagnosis not present

## 2023-02-13 DIAGNOSIS — I10 Essential (primary) hypertension: Secondary | ICD-10-CM | POA: Diagnosis not present

## 2023-02-13 DIAGNOSIS — E039 Hypothyroidism, unspecified: Secondary | ICD-10-CM | POA: Diagnosis not present

## 2023-02-13 DIAGNOSIS — E559 Vitamin D deficiency, unspecified: Secondary | ICD-10-CM | POA: Diagnosis not present

## 2023-02-13 DIAGNOSIS — I700 Atherosclerosis of aorta: Secondary | ICD-10-CM | POA: Diagnosis not present

## 2023-02-13 DIAGNOSIS — R7303 Prediabetes: Secondary | ICD-10-CM | POA: Diagnosis not present

## 2023-02-13 DIAGNOSIS — Z79899 Other long term (current) drug therapy: Secondary | ICD-10-CM | POA: Diagnosis not present

## 2023-02-13 DIAGNOSIS — E7800 Pure hypercholesterolemia, unspecified: Secondary | ICD-10-CM | POA: Diagnosis not present

## 2023-02-13 DIAGNOSIS — Z0000 Encounter for general adult medical examination without abnormal findings: Secondary | ICD-10-CM | POA: Diagnosis not present

## 2023-02-13 DIAGNOSIS — I422 Other hypertrophic cardiomyopathy: Secondary | ICD-10-CM | POA: Diagnosis not present

## 2023-02-13 DIAGNOSIS — I2510 Atherosclerotic heart disease of native coronary artery without angina pectoris: Secondary | ICD-10-CM | POA: Diagnosis not present

## 2023-02-25 DIAGNOSIS — I10 Essential (primary) hypertension: Secondary | ICD-10-CM | POA: Diagnosis not present

## 2023-07-09 ENCOUNTER — Ambulatory Visit: Payer: Medicare Other | Admitting: Cardiovascular Disease

## 2023-07-09 ENCOUNTER — Encounter: Payer: Self-pay | Admitting: Cardiovascular Disease

## 2023-07-09 VITALS — BP 112/64 | HR 74 | Ht <= 58 in | Wt 139.8 lb

## 2023-07-09 DIAGNOSIS — E782 Mixed hyperlipidemia: Secondary | ICD-10-CM

## 2023-07-09 DIAGNOSIS — I10 Essential (primary) hypertension: Secondary | ICD-10-CM | POA: Diagnosis not present

## 2023-07-09 DIAGNOSIS — I2510 Atherosclerotic heart disease of native coronary artery without angina pectoris: Secondary | ICD-10-CM

## 2023-07-09 NOTE — Progress Notes (Signed)
07/09/2023 Cynthia Garner   [birth year]  [id number]  Primary Physician Cynthia Palmer, MD Primary Cardiologist: Cynthia Gess MD Cynthia Garner, [place]  HPI:  Cynthia Garner is a 77 y.o.  moderately overweight widowed Caucasian female mother of one living son (1 son deceased), grandmother to 1 grandchild referred by Dr. Mila Garner for cardiovascular valuation because of calcium seen on chest CT.  She has seen Dr. Eden Emms and Dr. Johney Frame in the past for sinus tachycardia which no longer seems to be a problem.  I last saw her in the office 03/07/2021. Risk factors include 50 pack years of tobacco use having quit 7 years ago, treated hypertension and hyperlipidemia.  She is never had a heart attack or stroke.  She does complain of occasional dyspnea but denies chest pain.  She had a screening chest CT done 02/17/2020 revealing three-vessel coronary calcification.   I did get a coronary calcium score on her 03/08/2020 which was 451.  Her most recent lipid profile revealed an LDL of 62.  She is completely asymptomatic.   Current Meds  Medication Sig   aspirin 81 MG tablet Take 81 mg by mouth daily.   levothyroxine (SYNTHROID, LEVOTHROID) 88 MCG tablet Take 88 mcg by mouth daily before breakfast.   lisinopril-hydrochlorothiazide (PRINZIDE,ZESTORETIC) 20-12.5 MG per tablet Take 1 tablet by mouth daily.   metoprolol succinate (TOPROL-XL) 50 MG 24 hr tablet Take 50 mg by mouth daily.   simvastatin (ZOCOR) 20 MG tablet Take 20 mg by mouth every morning.    traZODone (DESYREL) 100 MG tablet Take 100 mg by mouth at bedtime as needed.     No Known Allergies  Social History   Socioeconomic History   Marital status: Widowed    Spouse name: Not on file   Number of children: Not on file   Years of education: Not on file   Highest education level: Not on file  Occupational History   Not on file  Tobacco Use   Smoking status: Former   Smokeless tobacco: Never  Substance and Sexual Activity    Alcohol use: Never   Drug use: Never   Sexual activity: Not Currently  Other Topics Concern   Not on file  Social History Narrative   Not on file   Social Determinants of Health   Financial Resource Strain: Not on file  Food Insecurity: Not on file  Transportation Needs: Not on file  Physical Activity: Not on file  Stress: Not on file  Social Connections: Not on file  Intimate Partner Violence: Not on file     Review of Systems: General: negative for chills, fever, night sweats or weight changes.  Cardiovascular: negative for chest pain, dyspnea on exertion, edema, orthopnea, palpitations, paroxysmal nocturnal dyspnea or shortness of breath Dermatological: negative for rash Respiratory: negative for cough or wheezing Urologic: negative for hematuria Abdominal: negative for nausea, vomiting, diarrhea, bright red blood per rectum, melena, or hematemesis Neurologic: negative for visual changes, syncope, or dizziness All other systems reviewed and are otherwise negative except as noted above.    Blood pressure 112/64, pulse 74, height 4\' 10"  (1.473 m), weight 139 lb 12.8 oz (63.4 kg), SpO2 93%.  General appearance: alert and no distress Neck: no adenopathy, no carotid bruit, no JVD, supple, symmetrical, trachea midline, and thyroid not enlarged, symmetric, no tenderness/mass/nodules Lungs: clear to auscultation bilaterally Heart: Sinus rhythm/regular rate and rhythm without murmurs gallops rubs or clicks Extremities: extremities normal, atraumatic, no cyanosis or  edema Pulses: 2+ and symmetric Skin: Skin color, texture, turgor normal. No rashes or lesions Neurologic: Grossly normal  EKG EKG Interpretation Date/Time:  Wednesday July 09 2023 14:46:45 EDT Ventricular Rate:  74 PR Interval:  162 QRS Duration:  94 QT Interval:  390 QTC Calculation: 432 R Axis:   8  Text Interpretation: Normal sinus rhythm Normal ECG When compared with ECG of 30-Aug-2014 15:38, No  significant change was found Confirmed by Nanetta Batty [phone]) on 07/09/2023 3:19:28 PM    ASSESSMENT AND PLAN:   Hyperlipidemia History of hyperlipidemia on statin therapy with lipid profile performed 02/13/23 revealing total cholesterol 125, LDL 62 and HDL of 41.  Essential hypertension History of essential hypertension blood pressure measured today at 112/64.  She is on lisinopril, hydrochlorothiazide and Toprol.  Coronary artery calcification seen on CT scan History of elevated coronary calcium score measured at 451 on 03/08/2020.  She is at goal regards to her lipid profile and is completely asymptomatic.     Cynthia Gess MD FACP,FACC,FAHA, [facility] 07/09/2023 3:27 PM

## 2023-07-09 NOTE — Assessment & Plan Note (Signed)
History of hyperlipidemia on statin therapy with lipid profile performed 02/13/23 revealing total cholesterol 125, LDL 62 and HDL of 41.

## 2023-07-09 NOTE — Assessment & Plan Note (Signed)
History of elevated coronary calcium score measured at 451 on 03/08/2020.  She is at goal regards to her lipid profile and is completely asymptomatic.

## 2023-07-09 NOTE — Patient Instructions (Signed)
Medication Instructions:  Your physician recommends that you continue on your current medications as directed. Please refer to the Current Medication list given to you today.  *If you need a refill on your cardiac medications before your next appointment, please call your pharmacy*   Follow-Up: At [facility], you and your health needs are our priority.  As part of our continuing mission to provide you with exceptional heart care, we have created designated Provider Care Teams.  These Care Teams include your primary Cardiologist (physician) and Advanced Practice Providers (APPs -  Physician Assistants and Nurse Practitioners) who all work together to provide you with the care you need, when you need it.  We recommend signing up for the patient portal called "MyChart".  Sign up information is provided on this After Visit Summary.  MyChart is used to connect with patients for Virtual Visits (Telemedicine).  Patients are able to view lab/test results, encounter notes, upcoming appointments, etc.  Non-urgent messages can be sent to your provider as well.   To learn more about what you can do with MyChart, go to [email or web address].    Your next appointment:   12 month(s)  Provider:   Bernadene Person, NP       Then, Nanetta Batty, MD will plan to see you again in 24 month(s).

## 2023-07-09 NOTE — Assessment & Plan Note (Signed)
History of essential hypertension blood pressure measured today at 112/64.  She is on lisinopril, hydrochlorothiazide and Toprol.

## 2023-12-29 DIAGNOSIS — Z961 Presence of intraocular lens: Secondary | ICD-10-CM | POA: Diagnosis not present

## 2023-12-29 DIAGNOSIS — H35372 Puckering of macula, left eye: Secondary | ICD-10-CM | POA: Diagnosis not present

## 2023-12-29 DIAGNOSIS — H26492 Other secondary cataract, left eye: Secondary | ICD-10-CM | POA: Diagnosis not present

## 2024-02-18 DIAGNOSIS — E039 Hypothyroidism, unspecified: Secondary | ICD-10-CM | POA: Diagnosis not present

## 2024-02-18 DIAGNOSIS — J439 Emphysema, unspecified: Secondary | ICD-10-CM | POA: Diagnosis not present

## 2024-02-18 DIAGNOSIS — E7800 Pure hypercholesterolemia, unspecified: Secondary | ICD-10-CM | POA: Diagnosis not present

## 2024-02-18 DIAGNOSIS — I2510 Atherosclerotic heart disease of native coronary artery without angina pectoris: Secondary | ICD-10-CM | POA: Diagnosis not present

## 2024-02-18 DIAGNOSIS — I700 Atherosclerosis of aorta: Secondary | ICD-10-CM | POA: Diagnosis not present

## 2024-02-18 DIAGNOSIS — I10 Essential (primary) hypertension: Secondary | ICD-10-CM | POA: Diagnosis not present

## 2024-02-18 DIAGNOSIS — R7303 Prediabetes: Secondary | ICD-10-CM | POA: Diagnosis not present

## 2024-02-18 DIAGNOSIS — E559 Vitamin D deficiency, unspecified: Secondary | ICD-10-CM | POA: Diagnosis not present

## 2024-02-18 DIAGNOSIS — N183 Chronic kidney disease, stage 3 unspecified: Secondary | ICD-10-CM | POA: Diagnosis not present

## 2024-02-18 DIAGNOSIS — Z0000 Encounter for general adult medical examination without abnormal findings: Secondary | ICD-10-CM | POA: Diagnosis not present

## 2024-02-18 DIAGNOSIS — F17211 Nicotine dependence, cigarettes, in remission: Secondary | ICD-10-CM | POA: Diagnosis not present

## 2024-04-06 ENCOUNTER — Other Ambulatory Visit: Payer: Self-pay | Admitting: Family Medicine

## 2024-04-06 DIAGNOSIS — F17211 Nicotine dependence, cigarettes, in remission: Secondary | ICD-10-CM
